# Patient Record
Sex: Female | Born: 1980 | Hispanic: No | Marital: Married | Smoking: Never smoker
Health system: Southern US, Community
[De-identification: ages and names within clinical notes are randomized; demographics above are authoritative.]

## PROBLEM LIST (undated history)

## (undated) ENCOUNTER — Inpatient Hospital Stay (HOSPITAL_COMMUNITY): Payer: Self-pay

## (undated) DIAGNOSIS — D219 Benign neoplasm of connective and other soft tissue, unspecified: Secondary | ICD-10-CM

## (undated) DIAGNOSIS — O139 Gestational [pregnancy-induced] hypertension without significant proteinuria, unspecified trimester: Secondary | ICD-10-CM

## (undated) DIAGNOSIS — R011 Cardiac murmur, unspecified: Secondary | ICD-10-CM

---

## 2000-01-27 ENCOUNTER — Emergency Department (HOSPITAL_COMMUNITY): Admission: EM | Admit: 2000-01-27 | Discharge: 2000-01-28 | Payer: Self-pay | Admitting: Emergency Medicine

## 2000-01-28 ENCOUNTER — Encounter: Payer: Self-pay | Admitting: Emergency Medicine

## 2007-06-01 ENCOUNTER — Emergency Department (HOSPITAL_COMMUNITY): Admission: EM | Admit: 2007-06-01 | Discharge: 2007-06-01 | Payer: Self-pay | Admitting: Emergency Medicine

## 2010-04-29 ENCOUNTER — Inpatient Hospital Stay (HOSPITAL_COMMUNITY): Admission: AD | Admit: 2010-04-29 | Payer: Self-pay | Admitting: Obstetrics and Gynecology

## 2010-06-27 ENCOUNTER — Inpatient Hospital Stay (HOSPITAL_COMMUNITY)
Admission: AD | Admit: 2010-06-27 | Discharge: 2010-06-29 | DRG: 886 | Disposition: A | Discharge status: Home / Self Care | Payer: BC Managed Care – PPO | Source: Ambulatory Visit | Attending: Obstetrics | Admitting: Obstetrics

## 2010-06-27 DIAGNOSIS — O139 Gestational [pregnancy-induced] hypertension without significant proteinuria, unspecified trimester: Principal | ICD-10-CM | POA: Diagnosis present

## 2010-06-27 DIAGNOSIS — Z22330 Carrier of Group B streptococcus: Secondary | ICD-10-CM

## 2010-06-27 DIAGNOSIS — O1490 Unspecified pre-eclampsia, unspecified trimester: Secondary | ICD-10-CM

## 2010-06-27 DIAGNOSIS — O99891 Other specified diseases and conditions complicating pregnancy: Secondary | ICD-10-CM | POA: Diagnosis present

## 2010-06-27 LAB — CBC
HCT: 33.3 % — ABNORMAL LOW (ref 36.0–46.0)
HCT: 32.6 % — ABNORMAL LOW (ref 36.0–46.0)
Hemoglobin: 11.5 g/dL — ABNORMAL LOW (ref 12.0–15.0)
Hemoglobin: 11.5 g/dL — ABNORMAL LOW (ref 12.0–15.0)
MCH: 28 pg (ref 26.0–34.0)
MCHC: 34.5 g/dL (ref 30.0–36.0)
MCHC: 35.3 g/dL (ref 30.0–36.0)
MCV: 81 fL (ref 78.0–100.0)
MCV: 80.7 fL (ref 78.0–100.0)
Platelets: 189 10*3/uL (ref 150–400)
RBC: 4.11 MIL/uL (ref 3.87–5.11)
RDW: 12.6 % (ref 11.5–15.5)
RDW: 12.6 % (ref 11.5–15.5)
WBC: 10.5 10*3/uL (ref 4.0–10.5)
WBC: 11.7 10*3/uL — ABNORMAL HIGH (ref 4.0–10.5)

## 2010-06-27 LAB — COMPREHENSIVE METABOLIC PANEL
ALT: 38 U/L — ABNORMAL HIGH (ref 0–35)
ALT: 38 U/L — ABNORMAL HIGH (ref 0–35)
AST: 38 U/L — ABNORMAL HIGH (ref 0–37)
Albumin: 2.6 g/dL — ABNORMAL LOW (ref 3.5–5.2)
Alkaline Phosphatase: 121 U/L — ABNORMAL HIGH (ref 39–117)
Alkaline Phosphatase: 124 U/L — ABNORMAL HIGH (ref 39–117)
BUN: 8 mg/dL (ref 6–23)
BUN: 9 mg/dL (ref 6–23)
CO2: 20 mEq/L (ref 19–32)
CO2: 19 mEq/L (ref 19–32)
Calcium: 8.8 mg/dL (ref 8.4–10.5)
Chloride: 107 mEq/L (ref 96–112)
Creatinine, Ser: 0.5 mg/dL (ref 0.4–1.2)
GFR calc Af Amer: >60 mL/min (ref >60)
GFR calc non Af Amer: >60 mL/min (ref >60)
GFR calc non Af Amer: >60 mL/min (ref >60)
Glucose, Bld: 88 mg/dL (ref 70–99)
Glucose, Bld: 121 mg/dL — ABNORMAL HIGH (ref 70–99)
Potassium: 3.4 mEq/L — ABNORMAL LOW (ref 3.5–5.1)
Potassium: 3.7 mEq/L (ref 3.5–5.1)
Sodium: 134 mEq/L — ABNORMAL LOW (ref 135–145)
Sodium: 131 mEq/L — ABNORMAL LOW (ref 135–145)
Total Bilirubin: 0.6 mg/dL (ref 0.3–1.2)
Total Bilirubin: 0.4 mg/dL (ref 0.3–1.2)
Total Protein: 6 g/dL (ref 6.0–8.3)
Total Protein: 6.1 g/dL (ref 6.0–8.3)

## 2010-06-27 LAB — URIC ACID: Uric Acid, Serum: 5.5 mg/dL (ref 2.4–7.0)

## 2010-06-27 LAB — LACTATE DEHYDROGENASE: LDH: 171 U/L (ref 94–250)

## 2010-06-28 ENCOUNTER — Ambulatory Visit (HOSPITAL_COMMUNITY)
Admit: 2010-06-28 | Discharge: 2010-06-28 | Disposition: A | Discharge status: Home / Self Care | Payer: BC Managed Care – PPO | Attending: Obstetrics | Admitting: Obstetrics

## 2010-06-28 LAB — COMPREHENSIVE METABOLIC PANEL
ALT: 39 U/L — ABNORMAL HIGH (ref 0–35)
AST: 42 U/L — ABNORMAL HIGH (ref 0–37)
Albumin: 2.5 g/dL — ABNORMAL LOW (ref 3.5–5.2)
Alkaline Phosphatase: 120 U/L — ABNORMAL HIGH (ref 39–117)
Calcium: 7.5 mg/dL — ABNORMAL LOW (ref 8.4–10.5)
GFR calc Af Amer: >60 mL/min (ref >60)
Glucose, Bld: 118 mg/dL — ABNORMAL HIGH (ref 70–99)
Potassium: 4.1 mEq/L (ref 3.5–5.1)
Sodium: 132 mEq/L — ABNORMAL LOW (ref 135–145)
Total Protein: 5.4 g/dL — ABNORMAL LOW (ref 6.0–8.3)

## 2010-06-28 LAB — CBC
HCT: 31.6 % — ABNORMAL LOW (ref 36.0–46.0)
MCHC: 34.5 g/dL (ref 30.0–36.0)
Platelets: 199 10*3/uL (ref 150–400)
RDW: 12.7 % (ref 11.5–15.5)
WBC: 14.4 10*3/uL — ABNORMAL HIGH (ref 4.0–10.5)

## 2010-06-28 LAB — URIC ACID: Uric Acid, Serum: 5.5 mg/dL (ref 2.4–7.0)

## 2010-06-29 LAB — PROTEIN, URINE, 24 HOUR
Collection Interval-UPROT: 24 hours
Urine Total Volume-UPROT: 3275 mL

## 2010-06-29 LAB — CREATININE CLEARANCE, URINE, 24 HOUR
Creatinine, 24H Ur: 1110 mg/d (ref 700–1800)
Creatinine, Urine: 33.9 mg/dL
Creatinine: 0.6 mg/dL (ref 0.4–1.2)

## 2010-07-21 ENCOUNTER — Encounter (HOSPITAL_COMMUNITY): Payer: Self-pay | Admitting: Radiology

## 2010-07-21 ENCOUNTER — Inpatient Hospital Stay (HOSPITAL_COMMUNITY): Payer: BC Managed Care – PPO

## 2010-07-21 ENCOUNTER — Other Ambulatory Visit (HOSPITAL_COMMUNITY): Payer: BC Managed Care – PPO

## 2010-07-21 ENCOUNTER — Inpatient Hospital Stay (HOSPITAL_COMMUNITY)
Admission: AD | Admit: 2010-07-21 | Discharge: 2010-07-29 | DRG: 651 | Disposition: A | Discharge status: Home Health | Payer: BC Managed Care – PPO | Source: Ambulatory Visit | Attending: Obstetrics | Admitting: Obstetrics

## 2010-07-21 DIAGNOSIS — D252 Subserosal leiomyoma of uterus: Secondary | ICD-10-CM | POA: Diagnosis present

## 2010-07-21 DIAGNOSIS — O99892 Other specified diseases and conditions complicating childbirth: Secondary | ICD-10-CM | POA: Diagnosis present

## 2010-07-21 DIAGNOSIS — D4959 Neoplasm of unspecified behavior of other genitourinary organ: Secondary | ICD-10-CM | POA: Diagnosis present

## 2010-07-21 DIAGNOSIS — O34599 Maternal care for other abnormalities of gravid uterus, unspecified trimester: Secondary | ICD-10-CM | POA: Diagnosis present

## 2010-07-21 DIAGNOSIS — O1414 Severe pre-eclampsia complicating childbirth: Principal | ICD-10-CM | POA: Diagnosis present

## 2010-07-21 DIAGNOSIS — O26849 Uterine size-date discrepancy, unspecified trimester: Secondary | ICD-10-CM

## 2010-07-21 DIAGNOSIS — Z22330 Carrier of Group B streptococcus: Secondary | ICD-10-CM

## 2010-07-21 LAB — URINALYSIS, ROUTINE W REFLEX MICROSCOPIC
Glucose, UA: NEGATIVE mg/dL
Ketones, ur: NEGATIVE mg/dL
Leukocytes, UA: NEGATIVE
pH: 6.5 (ref 5.0–8.0)

## 2010-07-21 LAB — COMPREHENSIVE METABOLIC PANEL
ALT: 23 U/L (ref 0–35)
AST: 32 U/L (ref 0–37)
Albumin: 2.4 g/dL — ABNORMAL LOW (ref 3.5–5.2)
Alkaline Phosphatase: 191 U/L — ABNORMAL HIGH (ref 39–117)
CO2: 20 mEq/L (ref 19–32)
Chloride: 107 mEq/L (ref 96–112)
GFR calc Af Amer: >60 mL/min (ref >60)
GFR calc non Af Amer: >60 mL/min (ref >60)
Potassium: 4.6 mEq/L (ref 3.5–5.1)
Total Bilirubin: 0.4 mg/dL (ref 0.3–1.2)

## 2010-07-21 LAB — CBC
Hemoglobin: 11.6 g/dL — ABNORMAL LOW (ref 12.0–15.0)
MCV: 79.5 fL (ref 78.0–100.0)
Platelets: 164 10*3/uL (ref 150–400)
RBC: 4.29 MIL/uL (ref 3.87–5.11)
WBC: 13.4 10*3/uL — ABNORMAL HIGH (ref 4.0–10.5)

## 2010-07-21 LAB — URINE MICROSCOPIC-ADD ON

## 2010-07-22 ENCOUNTER — Inpatient Hospital Stay (HOSPITAL_COMMUNITY): Payer: BC Managed Care – PPO

## 2010-07-22 LAB — COMPREHENSIVE METABOLIC PANEL
Albumin: 2.1 g/dL — ABNORMAL LOW (ref 3.5–5.2)
BUN: 13 mg/dL (ref 6–23)
Calcium: 7.5 mg/dL — ABNORMAL LOW (ref 8.4–10.5)
Chloride: 106 mEq/L (ref 96–112)
Creatinine, Ser: 0.77 mg/dL (ref 0.4–1.2)
Total Bilirubin: 0.5 mg/dL (ref 0.3–1.2)
Total Protein: 4.9 g/dL — ABNORMAL LOW (ref 6.0–8.3)

## 2010-07-22 LAB — CREATININE CLEARANCE, URINE, 24 HOUR
Collection Interval-CRCL: 24 hours
Creatinine, Urine: 41.1 mg/dL
Creatinine: 0.77 mg/dL (ref 0.4–1.2)
Urine Total Volume-CRCL: 2225 mL

## 2010-07-22 LAB — CBC
MCH: 26.4 pg (ref 26.0–34.0)
MCHC: 32.8 g/dL (ref 30.0–36.0)
MCV: 80.3 fL (ref 78.0–100.0)
Platelets: 156 10*3/uL (ref 150–400)
RDW: 13.8 % (ref 11.5–15.5)

## 2010-07-22 LAB — MAGNESIUM: Magnesium: 7.4 mg/dL (ref 1.5–2.5)

## 2010-07-23 LAB — URINE MICROSCOPIC-ADD ON

## 2010-07-23 LAB — COMPREHENSIVE METABOLIC PANEL
ALT: 18 U/L (ref 0–35)
ALT: 16 U/L (ref 0–35)
AST: 27 U/L (ref 0–37)
AST: 31 U/L (ref 0–37)
Albumin: 2 g/dL — ABNORMAL LOW (ref 3.5–5.2)
Alkaline Phosphatase: 150 U/L — ABNORMAL HIGH (ref 39–117)
BUN: 15 mg/dL (ref 6–23)
BUN: 14 mg/dL (ref 6–23)
CO2: 18 mEq/L — ABNORMAL LOW (ref 19–32)
CO2: 20 mEq/L (ref 19–32)
Calcium: 7.4 mg/dL — ABNORMAL LOW (ref 8.4–10.5)
Calcium: 7.5 mg/dL — ABNORMAL LOW (ref 8.4–10.5)
Chloride: 109 mEq/L (ref 96–112)
Creatinine, Ser: 0.69 mg/dL (ref 0.4–1.2)
Creatinine, Ser: 0.78 mg/dL (ref 0.4–1.2)
GFR calc Af Amer: >60 mL/min (ref >60)
GFR calc Af Amer: >60 mL/min (ref >60)
GFR calc non Af Amer: >60 mL/min (ref >60)
GFR calc non Af Amer: >60 mL/min (ref >60)
Glucose, Bld: 63 mg/dL — ABNORMAL LOW (ref 70–99)
Potassium: 4.3 mEq/L (ref 3.5–5.1)
Sodium: 133 mEq/L — ABNORMAL LOW (ref 135–145)
Total Bilirubin: 0.5 mg/dL (ref 0.3–1.2)
Total Bilirubin: 0.4 mg/dL (ref 0.3–1.2)
Total Protein: 5.1 g/dL — ABNORMAL LOW (ref 6.0–8.3)

## 2010-07-23 LAB — CBC
HCT: 31.2 % — ABNORMAL LOW (ref 36.0–46.0)
Hemoglobin: 10.4 g/dL — ABNORMAL LOW (ref 12.0–15.0)
Hemoglobin: 10.6 g/dL — ABNORMAL LOW (ref 12.0–15.0)
Hemoglobin: 11.1 g/dL — ABNORMAL LOW (ref 12.0–15.0)
MCH: 27 pg (ref 26.0–34.0)
MCH: 27 pg (ref 26.0–34.0)
MCHC: 33.5 g/dL (ref 30.0–36.0)
MCHC: 33.7 g/dL (ref 30.0–36.0)
MCV: 80.8 fL (ref 78.0–100.0)
MCV: 80.6 fL (ref 78.0–100.0)
Platelets: 128 10*3/uL — ABNORMAL LOW (ref 150–400)
Platelets: 141 10*3/uL — ABNORMAL LOW (ref 150–400)
RBC: 3.86 MIL/uL — ABNORMAL LOW (ref 3.87–5.11)
RDW: 13.8 % (ref 11.5–15.5)
WBC: 10.1 10*3/uL (ref 4.0–10.5)

## 2010-07-23 LAB — URINALYSIS, ROUTINE W REFLEX MICROSCOPIC
Bilirubin Urine: NEGATIVE
Glucose, UA: NEGATIVE mg/dL
Specific Gravity, Urine: >1.03 — ABNORMAL HIGH (ref 1.005–1.030)
Urobilinogen, UA: 0.2 mg/dL (ref 0.0–1.0)
pH: 8 (ref 5.0–8.0)

## 2010-07-23 LAB — URIC ACID
Uric Acid, Serum: 8.3 mg/dL — ABNORMAL HIGH (ref 2.4–7.0)
Uric Acid, Serum: 8.1 mg/dL — ABNORMAL HIGH (ref 2.4–7.0)

## 2010-07-23 LAB — MAGNESIUM
Magnesium: 6.2 mg/dL (ref 1.5–2.5)
Magnesium: 4.7 mg/dL — ABNORMAL HIGH (ref 1.5–2.5)
Magnesium: 5 mg/dL — ABNORMAL HIGH (ref 1.5–2.5)

## 2010-07-23 LAB — LACTATE DEHYDROGENASE: LDH: 227 U/L (ref 94–250)

## 2010-07-24 ENCOUNTER — Other Ambulatory Visit: Payer: Self-pay | Admitting: Obstetrics and Gynecology

## 2010-07-24 ENCOUNTER — Inpatient Hospital Stay (HOSPITAL_COMMUNITY): Payer: BC Managed Care – PPO

## 2010-07-24 LAB — CBC
HCT: 32.6 % — ABNORMAL LOW (ref 36.0–46.0)
Platelets: 130 10*3/uL — ABNORMAL LOW (ref 150–400)
RBC: 4.02 MIL/uL (ref 3.87–5.11)
RDW: 13.9 % (ref 11.5–15.5)
WBC: 12.4 10*3/uL — ABNORMAL HIGH (ref 4.0–10.5)

## 2010-07-24 LAB — MRSA PCR SCREENING: MRSA by PCR: NEGATIVE

## 2010-07-24 LAB — GLUCOSE, CAPILLARY
Glucose-Capillary: 109 mg/dL — ABNORMAL HIGH (ref 70–99)
Glucose-Capillary: 88 mg/dL (ref 70–99)

## 2010-07-24 LAB — MAGNESIUM
Magnesium: 5.8 mg/dL — ABNORMAL HIGH (ref 1.5–2.5)
Magnesium: 5.3 mg/dL — ABNORMAL HIGH (ref 1.5–2.5)

## 2010-07-24 LAB — PROTEIN, URINE, 24 HOUR: Urine Total Volume-UPROT: 2225 mL

## 2010-07-24 LAB — ABO/RH: ABO/RH(D): B NEG

## 2010-07-25 LAB — CBC
Platelets: 129 10*3/uL — ABNORMAL LOW (ref 150–400)
RDW: 14.1 % (ref 11.5–15.5)
WBC: 13.5 10*3/uL — ABNORMAL HIGH (ref 4.0–10.5)

## 2010-07-25 LAB — MAGNESIUM: Magnesium: 5.1 mg/dL — ABNORMAL HIGH (ref 1.5–2.5)

## 2010-07-25 LAB — RPR: RPR Ser Ql: NONREACTIVE

## 2010-07-26 LAB — RH IMMUNE GLOB WKUP(>/=20WKS)(NOT WOMEN'S HOSP)
Antibody Screen: POSITIVE
Fetal Screen: NEGATIVE

## 2010-07-26 NOTE — Op Note (Signed)
[name], Carolyn Galloway                ACCOUNT NO.:  [id number]  MEDICAL RECORD NO.:  [id number]           PATIENT TYPE:  I  LOCATION:  9372                          [facility]ILITY:  [facility]  PHYSICIAN:  Maxie Better, M.D.DATE OF BIRTH:  [birth year]  DATE OF PROCEDURE:  07/24/2010 DATE OF DISCHARGE:                              OPERATIVE REPORT   PREOPERATIVE DIAGNOSES: 1. Arrest of dilatation. 2. Severe preeclampsia. 3. Intrauterine gestation at 33-5/7 weeks.  PROCEDURES: 1. Primary cesarean section, Kerr hysterotomy.  POSTOPERATIVE DIAGNOSES: 1. Right occiput posterior presentation. 2. Arrest of dilatation. 3. Severe preeclampsia. 4. Intrauterine gestation at 33-5/7 weeks. 5. Fibroid uterus.  ANESTHESIA:  Epidural.  SURGEON:  Maxie Better, MD  ASSISTANT:  None.  PROCEDURE IN DETAIL:  Under adequate epidural anesthesia, the patient was placed in supine position with left lateral tilt.  An indwelling Foley catheter was already in place.  Marcaine 0.25% was injected along the planned Pfannenstiel skin incision site.  Pfannenstiel skin incision was then made and carried down to the rectus fascia.  Rectus fascia was opened transversely.  The rectus fascia was then bluntly and sharply dissected off the rectus muscle in superior and inferior fashion.  The rectus muscle was split in midline.  The parietal peritoneum was entered bluntly and extended.  A well developed lower uterine segment was noted.  The bladder flap was opened transversely and the bladder was bluntly dissected off the lower uterine segment.  A curvilinear low transverse uterine incision was then made and extended with bandage scissors.  Loop of cord produced projected into the incision.  Subsequent delivery of a live female from the right occiput posterior position was accomplished. Cord around the neck was reduced.  Baby was bulb suctioned in the abdomen.  Cord was clamped, cut, the baby was transferred to  the awaiting pediatrician, who assigned Apgars of 5 and 8 at 1 and 5 minutes.  The placenta was spontaneous, intact, small, sent to Pathology. Uterine cavity was cleaned of debris.  Uterine incision had no extension.  The uterine incision was closed in two layers, the first layer with zero Monocryl in running locked stitch, second layer is imbricated using 0 Monocryl sutures.  Good hemostasis was noted at that time.  Normal tubes and ovaries were noted bilaterally.  A large left posterior upper 6-cm subserosal fibroid was noted.  The abdomen was then copiously irrigated and suctioned the debris.  The parietal peritoneum was closed with 2-0 Vicryl.  The rectus fascia was closed with 0 Vicryl x2.  The subcutaneous area was irrigated, small bleeders cauterized. Interrupted 2-0 plain sutures placed.  4-0 Vicryl subcuticular stitches were then placed.  Specimen was placenta, sent to Pathology.  Estimated blood loss, 500 mL.  Urine output 250 mL concentrated urine. Intraoperative fluid, 1 liter.  Sponge and instrument counts x2 was correct.  Complications, none.  Weight of the baby was 3 pounds and 12 ounces.  The baby was transferred to the Neonatal Intensive Care Unit, and the mother was transferred to recovery in stable condition.     Maxie Better, M.D.     [place]/MEDQ  D:  07/24/2010  T:  07/25/2010  Job:  [id number]  Electronically Signed by Nena Jordan Krystyn Picking M.D. on 07/26/2010 05:26:48 AM

## 2010-07-28 LAB — COMPREHENSIVE METABOLIC PANEL
ALT: 127 U/L — ABNORMAL HIGH (ref 0–35)
Albumin: 2.2 g/dL — ABNORMAL LOW (ref 3.5–5.2)
Alkaline Phosphatase: 137 U/L — ABNORMAL HIGH (ref 39–117)
Glucose, Bld: 81 mg/dL (ref 70–99)
Potassium: 4.7 mEq/L (ref 3.5–5.1)
Sodium: 134 mEq/L — ABNORMAL LOW (ref 135–145)
Total Protein: 5.9 g/dL — ABNORMAL LOW (ref 6.0–8.3)

## 2010-07-28 LAB — DIFFERENTIAL
Basophils Absolute: 0 10*3/uL (ref 0.0–0.1)
Basophils Relative: 0 % (ref 0–1)
Eosinophils Absolute: 0.2 10*3/uL (ref 0.0–0.7)
Eosinophils Relative: 2 % (ref 0–5)
Lymphocytes Relative: 19 % (ref 12–46)
Lymphs Abs: 2.4 10*3/uL (ref 0.7–4.0)
Monocytes Absolute: 0.8 10*3/uL (ref 0.1–1.0)
Monocytes Relative: 6 % (ref 3–12)
Neutro Abs: 9.3 10*3/uL — ABNORMAL HIGH (ref 1.7–7.7)
Neutrophils Relative %: 73 % (ref 43–77)

## 2010-07-28 LAB — CBC
HCT: 31.9 % — ABNORMAL LOW (ref 36.0–46.0)
Hemoglobin: 10.3 g/dL — ABNORMAL LOW (ref 12.0–15.0)
MCH: 27.2 pg (ref 26.0–34.0)
MCHC: 32.3 g/dL (ref 30.0–36.0)
MCV: 84.2 fL (ref 78.0–100.0)
Platelets: 234 10*3/uL (ref 150–400)
RBC: 3.79 MIL/uL — ABNORMAL LOW (ref 3.87–5.11)
RDW: 15.1 % (ref 11.5–15.5)
WBC: 12.8 10*3/uL — ABNORMAL HIGH (ref 4.0–10.5)

## 2010-07-29 ENCOUNTER — Encounter (HOSPITAL_COMMUNITY)
Admission: RE | Admit: 2010-07-29 | Discharge: 2010-07-29 | Disposition: A | Discharge status: Home / Self Care | Payer: BC Managed Care – PPO | Source: Ambulatory Visit | Attending: Obstetrics and Gynecology | Admitting: Obstetrics and Gynecology

## 2010-07-29 DIAGNOSIS — O923 Agalactia: Secondary | ICD-10-CM | POA: Insufficient documentation

## 2010-07-29 LAB — COMPREHENSIVE METABOLIC PANEL
AST: 98 U/L — ABNORMAL HIGH (ref 0–37)
Albumin: 2 g/dL — ABNORMAL LOW (ref 3.5–5.2)
Calcium: 8.3 mg/dL — ABNORMAL LOW (ref 8.4–10.5)
Creatinine, Ser: 0.52 mg/dL (ref 0.4–1.2)
GFR calc Af Amer: >60 mL/min (ref >60)
GFR calc non Af Amer: >60 mL/min (ref >60)

## 2010-07-29 LAB — CBC
MCH: 27 pg (ref 26.0–34.0)
MCHC: 32.4 g/dL (ref 30.0–36.0)
Platelets: 207 10*3/uL (ref 150–400)

## 2010-07-29 LAB — DIFFERENTIAL
Basophils Relative: 1 % (ref 0–1)
Eosinophils Absolute: 0.3 10*3/uL (ref 0.0–0.7)
Eosinophils Relative: 2 % (ref 0–5)
Monocytes Absolute: 0.6 10*3/uL (ref 0.1–1.0)
Monocytes Relative: 5 % (ref 3–12)

## 2010-07-31 NOTE — Consult Note (Signed)
Carolyn Galloway, Carolyn Galloway                ACCOUNT NO.:  [id number]  MEDICAL RECORD NO.:  [id number]           PATIENT TYPE:  I  LOCATION:  9316                          [facility]:  [facility]  PHYSICIAN:  Hollice Espy, M.D.DATE OF BIRTH:  1980/05/23  DATE OF CONSULTATION:  07/28/2010 DATE OF DISCHARGE:                                CONSULTATION   ATTENDING PHYSICIAN:  Maxie Better, MD  REASON FOR CONSULTATION:  Request is management of uncontrolled high blood pressure, new diagnosis.  HISTORY OF PRESENT ILLNESS:  The patient is a 30 year old [place] female with no past medical history who has had a relatively uncomplicated pregnancy up until about 1 month prior when she was started noting to have elevated blood pressures.  Her only other notification was she put on about 30-40 pounds during her pregnancy, but otherwise did well and she was monitored closely and started having elevated blood pressure readings in the office requiring her to be admitted on July 21, 2010.  She was noted at that time to have a blood pressure of 184/114.  At this time, she was diagnosed, was felt to have severe pregnancy-induced hypertension and possible preeclampsia.  She has follow up liver function tests ordered, which were normal essentially as was kidney function and put on labetalol.  The patient was continued to be monitored when she started developing signs of some proteinuria and ended up being taken to the OR in the early morning hours of July 25, 2010, for emergent C-section secondary to severe preeclampsia.  Her post pressure, she remained in the adults ICU at [facility].  The surgery itself was uncomplicated and the infant was doing well; however, since early the patient's delivery, her blood pressures have continued to remain elevated with range anywhere from 130- 190 systolic and high 80s to 110s diastolic.  Despite multiple doses, the patient was started on labetalol, this  increased to 200 b.i.d., then increased to 200 t.i.d., then Procardia 30 and HCTZ 25 were added and the patient continued.  Meanwhile, the patient was asymptomatic.  She had no chest pain, no shortness of breath.  She has not had any significant pain.  Post C-section, she is not on any operative distress. No shortness of breath.  No other issues.  She feels fine other than notified for blood pressure.  By postop day #4, her blood pressure was still elevated 140-170 systolic, 85-120 diastolic.  [facility] was then called for further consult for further evaluation and assistance. At this point, then I increased her blood pressure medication of HCTZ from 25 to 50 and labetalol from 200 to 300 t.i.d. and I continued her Procardia.  She initially did briefly well, but then her blood pressure did elevate and she received a dose of IV Lopressor followed by dose of p.o. hydralazine 10 mg and p.o. hydralazine 10 mg 4 times a day.  After her first dose of several hours, her blood pressure since that time has started to dramatically improve and the blood pressure is now last checked at 131/92 with a heart rate of 80.  When I came to see  the patient about 5 o'clock in the evening, she was doing well.  She denies any headaches, vision changes, dysphagia, chest pain, palpitations, shortness of breath, wheeze, cough, abdominal pain, hematuria, dysuria, constipation, diarrhea, focal extremity numbness, weakness, pain.  REVIEW OF SYSTEMS:  Otherwise negative.  PAST MEDICAL HISTORY:  Other than, she is a G1, P1.  Complicated only pregnancy-induced hypertension and then preeclampsia.  She is also noted to be GBS positive.  MEDICATIONS:  Before her pregnancy she was on nothing.  During pregnancy, she was only on prenatal vitamins.  She does not take any herbal over-the-counter supplement.  ALLERGIES:  She has no known drug allergies.  SOCIAL HISTORY:  She denies any tobacco, alcohol or drug  use.  FAMILY HISTORY:  Noted for hypertension in her dad side of the family.  PHYSICAL EXAMINATION:  VITAL SIGNS:  Temperature 98.5, heart rate 80, blood pressure most recent 131/92 has risen to in the last 24 hours as high as systolic of 170, diastolic of 045. GENERAL:  She is alert and oriented x3 in no apparent distress. HEENT:  Normocephalic, atraumatic.  Mucous membranes are moist.  She has no carotid bruits. HEART:  Regular rate and rhythm.  S1 and S2.  She has a soft 2/6 benign systolic murmur, which she has had since birth, it is innocent sounding. LUNGS:  Clear to auscultation bilaterally. ABDOMEN:  Soft, nontender.  Her incision is nontender as well and nondistended.  She has normoactive bowel sounds. EXTREMITIES:  She has no clubbing or cyanosis.  She has a trace pitting edema.  LABORATORY DATA:  Mag level is high at 5.1.  White count elevated at 13.5, H and H 10 and 31, platelet count 129.  Her last electrolyte panel was done on May 25, pre-prepregnancy term.  Pre-prepregnancy sodium 133, potassium 4.2, chloride 106, bicarb 20, BUN 14, creatinine 0.8, glucose 89.  LFTs actually at that time are unremarkable except for now alk phos of 160 to be expected during her current state.  ASSESSMENT AND PLAN:  The patient with pregnancy-induced hypertension and preeclampsia with postpartum continuingly elevated blood pressures. The patient likely has bit of a delayed effect.  She is currently on Procardia, labetalol, hydrochlorothiazide and now hydralazine.  We are starting to see some improvement in her blood pressures.  I have started creating parameters with holding her hydralazine for systolic blood pressure less than 110 and holding all blood pressure medications should her blood pressure fall below 90.  The plan presented by the patient is that once her blood pressure is better controlled, she can be discharged to home.  We will have my partner followup to see the patient  on Saturday July 29, 2010, and if her blood pressures are improved, we perhaps may be able to streamline back some of her blood pressure medications, likely she will need to go home on blood pressure medication.  She has no PCP and only follows up with Dr. Cherly Hensen.  I am going to recommend that she do serial blood pressure checks at home. Continue on her medications with parameters to going to hold back or stop her blood pressure medicines if her blood pressure is too low and to call Dr. Wilson Singer when it is too high and then likely if everything is stable, then she will follow up in 1 week's time.  If her blood pressure shows decline, she is able to get off her medications, which I suspect will not indeed happen.  I do not think that  she will have longstanding hypertension and will not need to be on blood pressure medications long- term hopefully.  Of note, she has had a elevated CBC which is starting to trend up.  We would recommend repeat checking her CBC.  Pain does not look to be a factor in her elevated blood pressure.  There may be an underlying mild infectious process, so we will rule that out, which if corrected may assist with this plan.     Hollice Espy, M.D.     [facility]/MEDQ  D:  07/28/2010  T:  07/28/2010  Job:  [id number]  cc:   Maxie Better, M.D. Fax: [phone]  Electronically Signed by Virginia Rochester M.D. on 07/31/2010 02:37:52 PM

## 2010-08-29 ENCOUNTER — Encounter (HOSPITAL_COMMUNITY)
Admission: RE | Admit: 2010-08-29 | Discharge: 2010-08-29 | Disposition: A | Discharge status: Home / Self Care | Payer: BC Managed Care – PPO | Source: Ambulatory Visit | Attending: Obstetrics and Gynecology | Admitting: Obstetrics and Gynecology

## 2010-08-29 DIAGNOSIS — O923 Agalactia: Secondary | ICD-10-CM | POA: Insufficient documentation

## 2010-09-17 NOTE — Discharge Summary (Signed)
Carolyn Galloway, Carolyn Galloway                ACCOUNT NO.:  [id number]  MEDICAL RECORD NO.:  [id number]           PATIENT TYPE:  I  LOCATION:  9316                          [facility]  PHYSICIAN:  Maxie Better, M.D.DATE OF BIRTH:  [birth year]  DATE OF ADMISSION:  07/21/2010 DATE OF DISCHARGE:  07/29/2010                              DISCHARGE SUMMARY   ADMISSION DIAGNOSES: 1. Severe pregnancy-induced hypertension, rule out superimposed     preeclampsia. 2. Intrauterine gestation at 72 and 2/7 weeks.  DISCHARGE DIAGNOSES: 1. Severe preeclampsia. 2. Intrauterine gestation at 54 and 5/7 weeks, delivered, 3. Arrest of  dilatation. 4. Posterior fibroid. 5. Right occiput posterior presentation.  PROCEDURES:  A primary cesarean section, Kerr hysterotomy.  HISTORY OF PRESENT ILLNESS:  This is a 30 year old gravida 1, para 0 married female at 31 and 2/7 weeks, admitted to the antepartum service due to blood pressure in the office of 194/104 and complained of increased swelling.  The patient's prenatal course had been notable for admission from April 28 to April 29 for increased blood pressure and sent home on labetalol and bedrest.  The patient returned to the day of admission complaining of increased swelling of the face and legs over the past several days.  She had a 8-pound weight gain in a short time.  HOSPITAL COURSE:  The patient was admitted to the antepartum service. She was started on magnesium sulfate, labetalol IV was given.  Stat PIH test was ordered, ultrasound, NICU consultation, and 24-hour urine creatinine clearance was done.  The patient was already on betamethasone completed from her last admission.  The patient was placed on continuous monitoring.  She had a reactive tracing.  Urinalysis had revealed greater than 300 mg/dL of protein.  Her PIH labs subsequently showed normal platelet count of 164,000, hemoglobin 11.6, hematocrit 34.1, white count of 13.4, SGOT of  32, SGPT of 23, uric acid increased to 8.3. The other labs were normal.  The patient received IV labetalol.  She denied any other PIH symptoms.  Her cervix was closed, 70% soft, presenting part out of pelvis.  The patient had urine total protein of 2336 grams, platelets was 146K, uric acid was 8.3, this given the findings proceeding with induction was recommended and confirmed with Maternal Fetal Medicine.  The patient had a magnesium discontinued due elevated magnesium levels.  She was transferred to the antepartum service with cervical ripening, penicillin prophylaxis, and magnesium sulfate resumption at a lower dose.  The patient was induced.  Labetalol drip was initially started due to her blood pressures.  Low-dose Pitocin was subsequently started after 3 doses of Cytotec.  Magnesium levels were followed closely.  The patient subsequently had epidural for pain management.  On March 26, she was complaining of shortness of breath.  Her magnesium level was 5.8.  Her portable chest x-ray was done, which was negative. The cervix became 3-cm edematous, -3, -2 station, started to have some late decelerations, no repetitive triplets, couplets, and given the finding, decision was made to proceed a primary cesarean section. Please see the dictated operative note.  Procedure resulted in the delivery  of a live female, loop of cord around the neck and chest, 3 pounds 12 ounces who was transferred to the NICU, Apgars of 5 and 8, a 6- cm left subserosal fibroid was noted at the time of her surgery. Postoperatively, the patient was transferred to the intensive care unit. She was continued on magnesium sulfate.  Labetalol, hydrochlorothiazide was added.  The patient finally was diuresing.  Her magnesium sulfate was discontinued.  The CBC on postop day #1 showed a platelet count of 130,000, hemoglobin 11, hematocrit 32.6, white count of 12.4.  Repeat PIH labs had revealed elevation of her uric acid still  remained.  The ALT and AST remained normal.  The alk phosphatase was elevated.  The patient was subsequently transferred to intensive care unit after she was diuresing well, monitored closely on the floor.  She was also given Apresoline for blood pressure management.  By March 28, she was diuresing well.  She was transferred postpartum.  Procardia was added to her blood pressure regimens.  Her blood pressure ranged at that point between 140 to 180 over 94 to 108.  Her Procardia was 30 XL p.o. daily and labetalol was 200 mg p.o. b.i.d., hydrochlorothiazide 25 mg daily. Consultation was made for the hospitalist, Dr. Rito Ehrlich, due to persistent high blood pressures.  Her labetalol was subsequently increased, as was the hydrochlorothiazide.  By postop day #5, her blood pressure is 134-149/88-100, had intermittent increased levels.  Repeat CBC on postop day #5 showed a hemoglobin 9.7, hematocrit 29.9, platelet count of 207,000.  The rest of her liver studies had decreased from 134 SGOT and the SGPT to 103 from 127.  She was subsequently felt to be able to be discharged home.  DISPOSITION:  Home.  CONDITION:  Stable.DISCHARGE MEDICATIONS: 1. Hydralazine 10 mg p.o. q.i.d. 2. Labetalol 300 mg p.o. t.i.d. 3. Hydrochlorothiazide 50 mg p.o. daily. 4. Procardia XL 30 mg p.o. daily 5. Nu-Iron 150 mg p.o. b.i.d. 6. Tylox 1-2 tablets every 3-4 hours p.r.n. pain. 7. Motrin 800 mg p.o. q. 6-8 hours p.r.n. pain. 8. Prenatal vitamins 1 p.o. daily.  Followup appointment at [facility] for blood pressure management. Discharge instructions per the postpartum booklet and reiteration of the severe preeclampsia warning sign.     Maxie Better, M.D.     [place]/MEDQ  D:  09/11/2010  T:  09/11/2010  Job:  [id number]  Electronically Signed by Nena Jordan Daryn Pisani M.D. on 09/17/2010 02:33:42 PM

## 2010-09-29 ENCOUNTER — Encounter (HOSPITAL_COMMUNITY)
Admission: RE | Admit: 2010-09-29 | Discharge: 2010-09-29 | Disposition: A | Discharge status: Home / Self Care | Payer: BC Managed Care – PPO | Source: Ambulatory Visit | Attending: Obstetrics and Gynecology | Admitting: Obstetrics and Gynecology

## 2010-09-29 DIAGNOSIS — O923 Agalactia: Secondary | ICD-10-CM | POA: Insufficient documentation

## 2010-10-30 ENCOUNTER — Encounter (HOSPITAL_COMMUNITY)
Admission: RE | Admit: 2010-10-30 | Discharge: 2010-10-30 | Disposition: A | Discharge status: Home / Self Care | Payer: BC Managed Care – PPO | Source: Ambulatory Visit | Attending: Obstetrics and Gynecology | Admitting: Obstetrics and Gynecology

## 2010-10-30 DIAGNOSIS — O923 Agalactia: Secondary | ICD-10-CM | POA: Insufficient documentation

## 2011-11-08 LAB — OB RESULTS CONSOLE ABO/RH: RH Type: NEGATIVE

## 2011-11-08 LAB — OB RESULTS CONSOLE RPR: RPR: NONREACTIVE

## 2012-01-26 IMAGING — CR DG CHEST 1V PORT
1 series · 1 of 1 positions shown · non-contrast
Comparison: None.

CLINICAL DATA: Shortness of breath.  Hypertension.  Pregnant
female.

PORTABLE CHEST - 1 VIEW

[view not recorded]
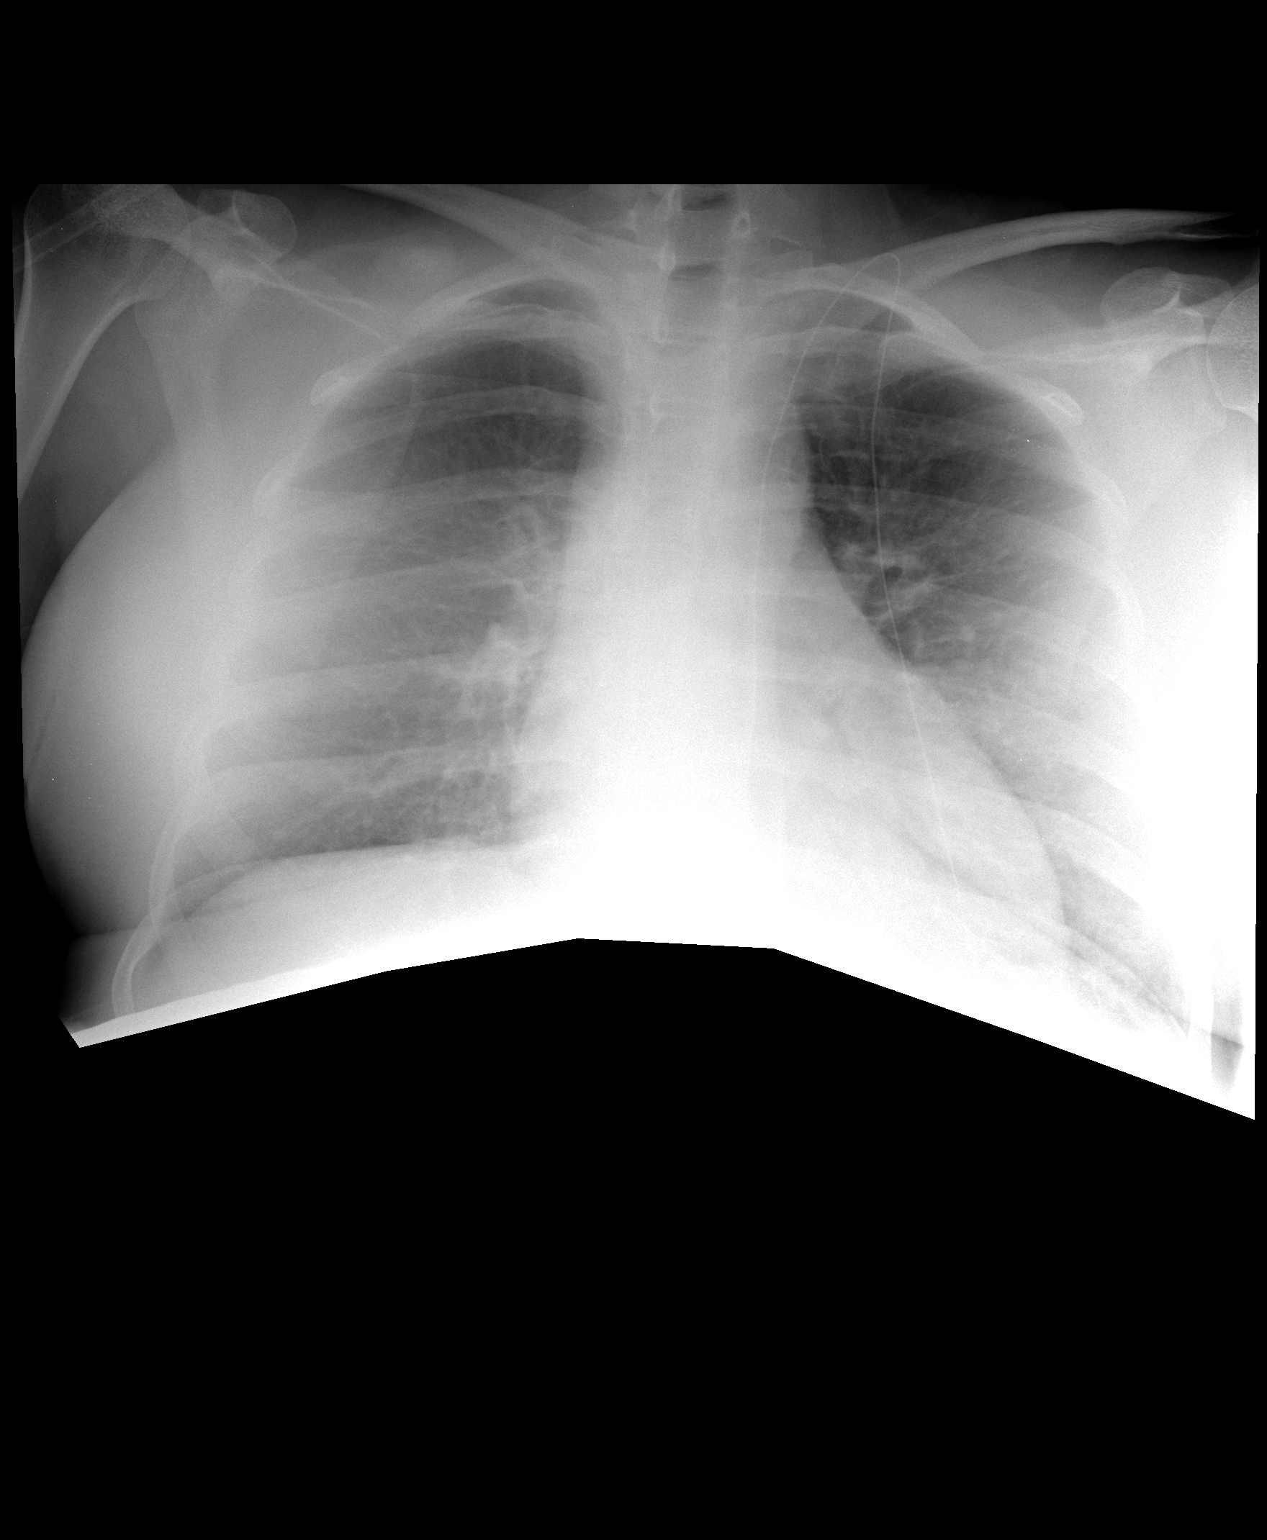

[1 of 1 positions shown; findings below may reference images not displayed]

FINDINGS: The lungs are clear.  Heart size is normal.  No
pneumothorax or pleural effusion.  No pulmonary edema.
IMPRESSION: No acute disease.

## 2012-04-13 ENCOUNTER — Inpatient Hospital Stay (HOSPITAL_COMMUNITY)
Admission: AD | Admit: 2012-04-13 | Discharge: 2012-04-15 | DRG: 886 | Disposition: A | Discharge status: Home / Self Care | Payer: BC Managed Care – PPO | Source: Ambulatory Visit | Attending: Obstetrics and Gynecology | Admitting: Obstetrics and Gynecology

## 2012-04-13 ENCOUNTER — Encounter (HOSPITAL_COMMUNITY): Payer: Self-pay | Admitting: *Deleted

## 2012-04-13 DIAGNOSIS — O34219 Maternal care for unspecified type scar from previous cesarean delivery: Secondary | ICD-10-CM | POA: Diagnosis present

## 2012-04-13 DIAGNOSIS — O139 Gestational [pregnancy-induced] hypertension without significant proteinuria, unspecified trimester: Principal | ICD-10-CM | POA: Diagnosis present

## 2012-04-13 HISTORY — DX: Gestational (pregnancy-induced) hypertension without significant proteinuria, unspecified trimester: O13.9

## 2012-04-13 HISTORY — DX: Cardiac murmur, unspecified: R01.1

## 2012-04-13 NOTE — MAU Note (Signed)
Pt reports "my blood pressure has been creeping up on me and i had preeclampsia with my first baby and i was getting worried"

## 2012-04-14 ENCOUNTER — Encounter (HOSPITAL_COMMUNITY): Payer: Self-pay | Admitting: *Deleted

## 2012-04-14 ENCOUNTER — Inpatient Hospital Stay (HOSPITAL_COMMUNITY): Payer: BC Managed Care – PPO

## 2012-04-14 DIAGNOSIS — O139 Gestational [pregnancy-induced] hypertension without significant proteinuria, unspecified trimester: Secondary | ICD-10-CM | POA: Diagnosis present

## 2012-04-14 LAB — URINALYSIS, ROUTINE W REFLEX MICROSCOPIC
Bilirubin Urine: NEGATIVE
Hgb urine dipstick: NEGATIVE
Nitrite: NEGATIVE
Specific Gravity, Urine: 1.01 (ref 1.005–1.030)
pH: 6.5 (ref 5.0–8.0)

## 2012-04-14 LAB — COMPREHENSIVE METABOLIC PANEL
ALT: 13 U/L (ref 0–35)
AST: 20 U/L (ref 0–37)
Alkaline Phosphatase: 166 U/L — ABNORMAL HIGH (ref 39–117)
CO2: 22 mEq/L (ref 19–32)
Calcium: 9.2 mg/dL (ref 8.4–10.5)
Chloride: 103 mEq/L (ref 96–112)
GFR calc Af Amer: >90 mL/min (ref >90)
GFR calc non Af Amer: >90 mL/min (ref >90)
Glucose, Bld: 98 mg/dL (ref 70–99)
Sodium: 135 mEq/L (ref 135–145)
Total Bilirubin: 0.2 mg/dL — ABNORMAL LOW (ref 0.3–1.2)

## 2012-04-14 LAB — PREPARE RBC (CROSSMATCH)

## 2012-04-14 LAB — CBC
Hemoglobin: 10.6 g/dL — ABNORMAL LOW (ref 12.0–15.0)
MCH: 26.1 pg (ref 26.0–34.0)
RBC: 4.06 MIL/uL (ref 3.87–5.11)
WBC: 11.5 10*3/uL — ABNORMAL HIGH (ref 4.0–10.5)

## 2012-04-14 LAB — OB RESULTS CONSOLE GC/CHLAMYDIA: Chlamydia: NEGATIVE

## 2012-04-14 MED ORDER — ACETAMINOPHEN 325 MG PO TABS: 650.0000 mg | ORAL_TABLET | ORAL | Status: DC | PRN

## 2012-04-14 MED ORDER — SODIUM CHLORIDE 0.9 % IV SOLN: 250.0000 mL | INTRAVENOUS | Status: DC | PRN

## 2012-04-14 MED ORDER — SODIUM CHLORIDE 0.9 % IJ SOLN
3.0000 mL | INTRAMUSCULAR | Status: DC | PRN
  Administered 2012-04-15: 3 mL via INTRAVENOUS

## 2012-04-14 MED ORDER — ZOLPIDEM TARTRATE 5 MG PO TABS: 5.0000 mg | ORAL_TABLET | Freq: Every evening | ORAL | Status: DC | PRN

## 2012-04-14 MED ORDER — BETAMETHASONE SOD PHOS & ACET 6 (3-3) MG/ML IJ SUSP
12.0000 mg | INTRAMUSCULAR | Status: AC
  Administered 2012-04-14 – 2012-04-15 (×2): 12 mg via INTRAMUSCULAR
  Filled 2012-04-14 (×2): qty 2

## 2012-04-14 MED ORDER — DOCUSATE SODIUM 100 MG PO CAPS
100.0000 mg | ORAL_CAPSULE | Freq: Every day | ORAL | Status: DC
  Administered 2012-04-14 – 2012-04-15 (×2): 100 mg via ORAL
  Filled 2012-04-14 (×2): qty 1

## 2012-04-14 MED ORDER — CALCIUM CARBONATE ANTACID 500 MG PO CHEW: 2.0000 | CHEWABLE_TABLET | ORAL | Status: DC | PRN

## 2012-04-14 MED ORDER — LABETALOL HCL 100 MG PO TABS
100.0000 mg | ORAL_TABLET | Freq: Two times a day (BID) | ORAL | Status: DC
  Administered 2012-04-14 – 2012-04-15 (×4): 100 mg via ORAL
  Filled 2012-04-14 (×5): qty 1

## 2012-04-14 MED ORDER — SODIUM CHLORIDE 0.9 % IJ SOLN
3.0000 mL | Freq: Two times a day (BID) | INTRAMUSCULAR | Status: DC
  Administered 2012-04-14 (×2): 3 mL via INTRAVENOUS

## 2012-04-14 MED ORDER — PRENATAL MULTIVITAMIN CH
1.0000 | ORAL_TABLET | Freq: Every day | ORAL | Status: DC
  Administered 2012-04-14 – 2012-04-15 (×2): 1 via ORAL
  Filled 2012-04-14 (×2): qty 1

## 2012-04-14 NOTE — Progress Notes (Signed)
Admission nutrition screen triggered, weight loss > 10 Lbs in one month. PNR indicates no weight loss, and steady weight gain.  Patients chart reviewed and assessed  for nutritional risk. Patient is determined to be at low nutrition  risk.  Elisabeth Cara M.Odis Luster LDN Neonatal Nutrition Support Specialist Pager [phone]

## 2012-04-14 NOTE — Progress Notes (Signed)
S: feels well. Denies any h/a (+) FM  O: BP 119/77 afebrile abd gravid nontender Extr. No edema  Tracing: baseline 140 reactive occ ctx GBS cx pending Urine collection  In progress  PIH labs nl  IMP: PIH managed w/ current BP med IUP @ 33 1/7 week Prev LTCS P) 2nd betatmethasone. Await 24 hr urine results. Cont labetalol

## 2012-04-14 NOTE — H&P (Signed)
Carolyn Galloway is a 31 y.o. female presenting  @ [redacted] weeks gestation w/ hx LTCS with c/o headache and elev BP at home. Prev delivery @ 32 weeks for severe preeclampsia. Pt denies leg swelling or blurred vision. (+) FM no epigastric pain. BP here initially  130/103, 140/108 History OB History    Grav Para Term Preterm Abortions TAB SAB Ect Mult Living   2 1 0 1      1     Past Medical History  Diagnosis Date  . Pregnancy induced hypertension   . Heart murmur    Past Surgical History  Procedure Date  . Cesarean section    Family History: family history is not on file. Social History:  reports that she has never smoked. She has never used smokeless tobacco. She reports that she does not drink alcohol or use illicit drugs.   Prenatal Transfer Tool  Maternal Diabetes: No Genetic Screening: Declined Maternal Ultrasounds/Referrals: Normal Fetal Ultrasounds or other Referrals:  None Maternal Substance Abuse:  No Significant Maternal Medications:  None Significant Maternal Lab Results:  Rh neg Other Comments:  None  Review of Systems  HENT:       Mild  Gastrointestinal: Negative for heartburn.  Neurological: Positive for headaches.  All other systems reviewed and are negative.      Blood pressure 132/96, pulse 95, resp. rate 18, height 4' 11.75" (1.518 m), weight 68.493 kg (151 lb), SpO2 98.00%. Maternal Exam:  Uterine Assessment: Contraction frequency is rare.   Abdomen: Surgical scars: low transverse.   Fetal presentation: vertex  Introitus: Ferning test: not done.  Amniotic fluid character: not assessed.  Cervix: Cervix evaluated by digital exam.    Ve; ft/60/-3 Physical Exam  Constitutional: She is oriented to person, place, and time. She appears well-developed and well-nourished.  HENT:  Head: Normocephalic.  Eyes: EOM are normal.  Neck: Neck supple.  Cardiovascular: Regular rhythm.   Respiratory: Breath sounds normal.  GI: Bowel sounds are normal.   Musculoskeletal: She exhibits no edema.  Neurological: She is alert and oriented to person, place, and time.  Skin: Skin is warm and dry.  Psychiatric: She has a normal mood and affect.    Prenatal labs: ABO, Rh:  B neg Antibody:  neg Rubella:  Immune RPR:   NR HBsAg:   neg HIV:   Neg GBS:   pending  Assessment/Plan: PIH r/o preeclampsia Prev LTCS desires VBAC IUP @ 33 1/7 week Rh neg  P) admit. 24 hr urine collection Protein/crcl. Betamethasone. Hold magnesium sulfate. sono for EFW. Start labetalol. Serial BP. Daily weights cont fetal monitor. GBS cx done. PIH labs pending  Oneita Allmon A 04/14/2012, 12:42 AM

## 2012-04-14 NOTE — Plan of Care (Signed)
Problem: Consults Goal: Birthing Suites Patient Information Press F2 to bring up selections list Outcome: Completed/Met Date Met:  04/14/12  Pt < [redacted] weeks EGA and PIH (Pregnancy induced hypertension)

## 2012-04-15 DIAGNOSIS — O34219 Maternal care for unspecified type scar from previous cesarean delivery: Secondary | ICD-10-CM | POA: Diagnosis present

## 2012-04-15 LAB — CREATININE CLEARANCE, URINE, 24 HOUR
Collection Interval-CRCL: 24 hours
Creatinine Clearance: 138 mL/min — ABNORMAL HIGH (ref 75–115)
Creatinine, 24H Ur: 1136 mg/d (ref 700–1800)
Creatinine, Urine: 56.78 mg/dL
Creatinine: 0.57 mg/dL (ref 0.50–1.10)

## 2012-04-15 LAB — PROTEIN, URINE, 24 HOUR
Protein, 24H Urine: 100 mg/d (ref 50–100)
Protein, Urine: 5 mg/dL

## 2012-04-15 LAB — URINE CULTURE: Colony Count: 6000

## 2012-04-15 MED ORDER — LABETALOL HCL 100 MG PO TABS: 100.0000 mg | ORAL_TABLET | Freq: Two times a day (BID) | ORAL | Status: DC

## 2012-04-15 NOTE — Progress Notes (Signed)
S; denies h/a, visual changes. Denies feeling ctx  VS: Afebrile BP 121/70-140/79 Lungs: clear to A Cor RRR Abd; gravid nontender Extr(-)edema or calf tenderness  Tracing; reactive occ ctx  UTP 100mg /24 hr IMP: PIH on labetalol. BMZ complete Previous C/S P) d/c home  Cont labetalol. PIH warning signs. PTL precaution. OOW. F/u 3 days

## 2012-04-15 NOTE — Discharge Summary (Signed)
Obstetric Discharge Summary Reason for Admission: PIH, IUP @ 33 1/7 week Prenatal Procedures: NST and ultrasound Intrapartum Procedures: betamethasone Postpartum Procedures: n/a Complications-Operative and Postpartum: none Hemoglobin  Date Value Range Status  04/13/2012 10.6* 12.0 - 15.0 g/dL Final     HCT  Date Value Range Status  04/13/2012 31.2* 36.0 - 46.0 % Final    Physical Exam:  General: WDWN female in NAD Lungs clear to A Abd gravid nontender Extremity no edema or calf tenderness  HOSPITAL COURSE: pt was admitted to antepartum service. She was started on oral labetalol. PIH labs were done and was normal. 24 hour urine collection started: UTP 100mg /24 hr. BMZ completed 12/15, 12/16 Continuous fetal monitor showed irreg ctx, reactive NST. sono done showed AGA( 5lb 5 oz), nl AFI Previous LTCS. GBS cx pending Discharge Diagnoses: PIH, undelivered  Discharge Information: Date: 04/15/2012 Activity: OOW Diet: no added salt Medications: labetalol Condition: stable Instructions: PIH warning signs, PTL prec Discharge to: home Follow-up Information    Follow up with Dominico Rod A, MD. On 04/18/2012.   Contact information:   [address] [place] [address] [phone]          Newborn Data: This patient has no babies on file  Naylah Cork A 04/15/2012, 3:30 PM

## 2012-04-16 ENCOUNTER — Inpatient Hospital Stay (HOSPITAL_COMMUNITY)
Admission: AD | Admit: 2012-04-16 | Discharge: 2012-04-16 | Disposition: A | Discharge status: Home / Self Care | Payer: BC Managed Care – PPO | Source: Ambulatory Visit | Attending: Obstetrics and Gynecology | Admitting: Obstetrics and Gynecology

## 2012-04-16 ENCOUNTER — Encounter (HOSPITAL_COMMUNITY): Payer: Self-pay | Admitting: *Deleted

## 2012-04-16 DIAGNOSIS — O4700 False labor before 37 completed weeks of gestation, unspecified trimester: Secondary | ICD-10-CM | POA: Insufficient documentation

## 2012-04-16 LAB — TYPE AND SCREEN
ABO/RH(D): B NEG
Unit division: 0

## 2012-04-16 LAB — CULTURE, BETA STREP (GROUP B ONLY)

## 2012-04-16 NOTE — MAU Note (Signed)
Pt G2 P1 at 33.3wks, having contractions every 5-70min.  Pt was discharged from Antenatal Unit 12/17 for elevated BP.

## 2012-04-16 NOTE — MAU Note (Signed)
History     Chief Complaint  Patient presents with  . Contractions  31 yo [id number] married female now @ 33 3/[redacted] weeks gestation presents for evaluation of ctx. (+) FM. Pt was discharged from hosp 12/17 from [facility] evaluation and was given betamethasone   OB History    Grav Para Term Preterm Abortions TAB SAB Ect Mult Living   2 1 0 1      1      Past Medical History  Diagnosis Date  . Pregnancy induced hypertension   . Heart murmur     no medication    Past Surgical History  Procedure Date  . Cesarean section     History reviewed. No pertinent family history.  History  Substance Use Topics  . Smoking status: Never Smoker   . Smokeless tobacco: Never Used  . Alcohol Use: No    Allergies: No Known Allergies  Prescriptions prior to admission  Medication Sig Dispense Refill  . labetalol (NORMODYNE) 100 MG tablet Take 1 tablet (100 mg total) by mouth 2 (two) times daily.  60 tablet  4  . ranitidine (ZANTAC) 150 MG tablet Take 150 mg by mouth 2 (two) times daily.         Physical Exam   Blood pressure 123/88, pulse 102, temperature 97.8 F (36.6 C), temperature source Oral, resp. rate 18, height 5' (1.524 m), weight 68.04 kg (150 lb).  General appearance: alert, cooperative and no distress Abdomen: gravid soft nontender Pelvic: FT/60/-3 posterior ED Course  Tracing: baseline 135- 140 reactive irreg ctx  P) d/c home. PTL prec. Call for appt 12/20. Cont labetalol. Increase oral intake  MDM     Rush Salce A, MD 3:49 AM 04/16/2012

## 2012-04-30 NOTE — L&D Delivery Note (Signed)
Delivery Note At 8:38 PM a viable and healthy female was delivered via VBAC, Spontaneous (Presentation: ; Occiput Anterior).  APGAR:9 ,9 ; weight pending.   Placenta status: Intact, Spontaneous.  Cord:  with the following complications:none .  Cord pH: na  Anesthesia: Epidural  Episiotomy: None Lacerations: 2nd degree Suture Repair: 2.0 vicryl rapide Est. Blood Loss (mL): 300  Mom to postpartum.  Baby to nursery-stable.  Shamia Uppal J 05/15/2012, 8:49 PM

## 2012-05-15 ENCOUNTER — Encounter (HOSPITAL_COMMUNITY): Payer: Self-pay | Admitting: Anesthesiology

## 2012-05-15 ENCOUNTER — Encounter (HOSPITAL_COMMUNITY): Payer: Self-pay | Admitting: *Deleted

## 2012-05-15 ENCOUNTER — Inpatient Hospital Stay (HOSPITAL_COMMUNITY): Payer: BC Managed Care – PPO | Admitting: Anesthesiology

## 2012-05-15 ENCOUNTER — Inpatient Hospital Stay (HOSPITAL_COMMUNITY)
Admission: AD | Admit: 2012-05-15 | Discharge: 2012-05-17 | DRG: 372 | Disposition: A | Discharge status: Home / Self Care | Payer: BC Managed Care – PPO | Source: Ambulatory Visit | Attending: Obstetrics & Gynecology | Admitting: Obstetrics & Gynecology

## 2012-05-15 DIAGNOSIS — O4290 Premature rupture of membranes, unspecified as to length of time between rupture and onset of labor, unspecified weeks of gestation: Secondary | ICD-10-CM | POA: Diagnosis present

## 2012-05-15 DIAGNOSIS — O139 Gestational [pregnancy-induced] hypertension without significant proteinuria, unspecified trimester: Principal | ICD-10-CM | POA: Diagnosis present

## 2012-05-15 DIAGNOSIS — D509 Iron deficiency anemia, unspecified: Secondary | ICD-10-CM | POA: Diagnosis present

## 2012-05-15 DIAGNOSIS — O34219 Maternal care for unspecified type scar from previous cesarean delivery: Secondary | ICD-10-CM | POA: Diagnosis present

## 2012-05-15 DIAGNOSIS — O9902 Anemia complicating childbirth: Secondary | ICD-10-CM | POA: Diagnosis present

## 2012-05-15 HISTORY — DX: Benign neoplasm of connective and other soft tissue, unspecified: D21.9

## 2012-05-15 LAB — CBC
MCH: 24.8 pg — ABNORMAL LOW (ref 26.0–34.0)
MCHC: 32.8 g/dL (ref 30.0–36.0)
MCV: 75.6 fL — ABNORMAL LOW (ref 78.0–100.0)
MCV: 75.2 fL — ABNORMAL LOW (ref 78.0–100.0)
Platelets: 250 10*3/uL (ref 150–400)
Platelets: 215 10*3/uL (ref 150–400)
RBC: 4.27 MIL/uL (ref 3.87–5.11)
RDW: 14.2 % (ref 11.5–15.5)
WBC: 19.8 10*3/uL — ABNORMAL HIGH (ref 4.0–10.5)

## 2012-05-15 LAB — PROTEIN / CREATININE RATIO, URINE
Creatinine, Urine: 45.76 mg/dL
Protein Creatinine Ratio: 0.14 (ref 0.00–0.15)

## 2012-05-15 LAB — COMPREHENSIVE METABOLIC PANEL
ALT: 16 U/L (ref 0–35)
AST: 22 U/L (ref 0–37)
CO2: 21 mEq/L (ref 19–32)
Calcium: 8.9 mg/dL (ref 8.4–10.5)
Chloride: 104 mEq/L (ref 96–112)
Creatinine, Ser: 0.56 mg/dL (ref 0.50–1.10)
GFR calc Af Amer: >90 mL/min (ref >90)
GFR calc non Af Amer: >90 mL/min (ref >90)
Glucose, Bld: 73 mg/dL (ref 70–99)
Total Bilirubin: 0.3 mg/dL (ref 0.3–1.2)

## 2012-05-15 LAB — PREPARE RBC (CROSSMATCH)

## 2012-05-15 MED ORDER — FENTANYL 2.5 MCG/ML BUPIVACAINE 1/10 % EPIDURAL INFUSION (WH - ANES)
14.0000 mL/h | INTRAMUSCULAR | Status: DC
  Administered 2012-05-15: 12 mL/h via EPIDURAL
  Filled 2012-05-15: qty 125

## 2012-05-15 MED ORDER — LIDOCAINE HCL (PF) 1 % IJ SOLN
INTRAMUSCULAR | Status: DC | PRN
  Administered 2012-05-15: 4 mL
  Administered 2012-05-15: 30 mL
  Administered 2012-05-15 (×2): 4 mL

## 2012-05-15 MED ORDER — LANOLIN HYDROUS EX OINT: TOPICAL_OINTMENT | CUTANEOUS | Status: DC | PRN

## 2012-05-15 MED ORDER — ONDANSETRON HCL 4 MG PO TABS: 4.0000 mg | ORAL_TABLET | ORAL | Status: DC | PRN

## 2012-05-15 MED ORDER — WITCH HAZEL-GLYCERIN EX PADS: 1.0000 "application " | MEDICATED_PAD | CUTANEOUS | Status: DC | PRN

## 2012-05-15 MED ORDER — OXYTOCIN 40 UNITS IN LACTATED RINGERS INFUSION - SIMPLE MED
1.0000 m[IU]/min | INTRAVENOUS | Status: DC
  Administered 2012-05-15: 1 m[IU]/min via INTRAVENOUS

## 2012-05-15 MED ORDER — LABETALOL HCL 100 MG PO TABS
100.0000 mg | ORAL_TABLET | Freq: Every day | ORAL | Status: DC
  Filled 2012-05-15: qty 1

## 2012-05-15 MED ORDER — OXYTOCIN 40 UNITS IN LACTATED RINGERS INFUSION - SIMPLE MED
62.5000 mL/h | INTRAVENOUS | Status: DC
  Administered 2012-05-15: 62.5 mL/h via INTRAVENOUS
  Filled 2012-05-15: qty 1000

## 2012-05-15 MED ORDER — SIMETHICONE 80 MG PO CHEW: 80.0000 mg | CHEWABLE_TABLET | ORAL | Status: DC | PRN

## 2012-05-15 MED ORDER — PHENYLEPHRINE 40 MCG/ML (10ML) SYRINGE FOR IV PUSH (FOR BLOOD PRESSURE SUPPORT)
80.0000 ug | PREFILLED_SYRINGE | INTRAVENOUS | Status: DC | PRN
  Filled 2012-05-15: qty 5

## 2012-05-15 MED ORDER — TERBUTALINE SULFATE 1 MG/ML IJ SOLN: 0.2500 mg | Freq: Once | INTRAMUSCULAR | Status: DC | PRN

## 2012-05-15 MED ORDER — DIPHENHYDRAMINE HCL 25 MG PO CAPS: 25.0000 mg | ORAL_CAPSULE | Freq: Four times a day (QID) | ORAL | Status: DC | PRN

## 2012-05-15 MED ORDER — DIBUCAINE 1 % RE OINT: 1.0000 "application " | TOPICAL_OINTMENT | RECTAL | Status: DC | PRN

## 2012-05-15 MED ORDER — LACTATED RINGERS IV SOLN
500.0000 mL | Freq: Once | INTRAVENOUS | Status: AC
  Administered 2012-05-15: 1000 mL via INTRAVENOUS

## 2012-05-15 MED ORDER — BENZOCAINE-MENTHOL 20-0.5 % EX AERO: 1.0000 "application " | INHALATION_SPRAY | CUTANEOUS | Status: DC | PRN

## 2012-05-15 MED ORDER — ACETAMINOPHEN 325 MG PO TABS: 650.0000 mg | ORAL_TABLET | ORAL | Status: DC | PRN

## 2012-05-15 MED ORDER — IBUPROFEN 600 MG PO TABS: 600.0000 mg | ORAL_TABLET | Freq: Four times a day (QID) | ORAL | Status: DC | PRN

## 2012-05-15 MED ORDER — IBUPROFEN 600 MG PO TABS
600.0000 mg | ORAL_TABLET | Freq: Four times a day (QID) | ORAL | Status: DC
  Administered 2012-05-16 – 2012-05-17 (×6): 600 mg via ORAL
  Filled 2012-05-15 (×6): qty 1

## 2012-05-15 MED ORDER — SENNOSIDES-DOCUSATE SODIUM 8.6-50 MG PO TABS
2.0000 | ORAL_TABLET | Freq: Every day | ORAL | Status: DC
  Administered 2012-05-16: 2 via ORAL

## 2012-05-15 MED ORDER — ONDANSETRON HCL 4 MG/2ML IJ SOLN: 4.0000 mg | Freq: Four times a day (QID) | INTRAMUSCULAR | Status: DC | PRN

## 2012-05-15 MED ORDER — LIDOCAINE HCL (PF) 1 % IJ SOLN
30.0000 mL | INTRAMUSCULAR | Status: DC | PRN
  Filled 2012-05-15: qty 30

## 2012-05-15 MED ORDER — OXYCODONE-ACETAMINOPHEN 5-325 MG PO TABS: 1.0000 | ORAL_TABLET | ORAL | Status: DC | PRN

## 2012-05-15 MED ORDER — EPHEDRINE 5 MG/ML INJ: 10.0000 mg | INTRAVENOUS | Status: DC | PRN

## 2012-05-15 MED ORDER — CITRIC ACID-SODIUM CITRATE 334-500 MG/5ML PO SOLN: 30.0000 mL | ORAL | Status: DC | PRN

## 2012-05-15 MED ORDER — LACTATED RINGERS IV SOLN
INTRAVENOUS | Status: DC
  Administered 2012-05-15: 09:00:00 via INTRAVENOUS

## 2012-05-15 MED ORDER — LABETALOL HCL 100 MG PO TABS
100.0000 mg | ORAL_TABLET | Freq: Two times a day (BID) | ORAL | Status: DC
Start: 2012-05-15 — End: 2012-05-15
  Administered 2012-05-15: 100 mg via ORAL
  Filled 2012-05-15 (×3): qty 1

## 2012-05-15 MED ORDER — PHENYLEPHRINE 40 MCG/ML (10ML) SYRINGE FOR IV PUSH (FOR BLOOD PRESSURE SUPPORT): 80.0000 ug | PREFILLED_SYRINGE | INTRAVENOUS | Status: DC | PRN

## 2012-05-15 MED ORDER — ZOLPIDEM TARTRATE 5 MG PO TABS: 5.0000 mg | ORAL_TABLET | Freq: Every evening | ORAL | Status: DC | PRN

## 2012-05-15 MED ORDER — EPHEDRINE 5 MG/ML INJ
10.0000 mg | INTRAVENOUS | Status: DC | PRN
  Filled 2012-05-15: qty 4

## 2012-05-15 MED ORDER — PRENATAL MULTIVITAMIN CH
1.0000 | ORAL_TABLET | Freq: Every day | ORAL | Status: DC
  Filled 2012-05-15 (×2): qty 1

## 2012-05-15 MED ORDER — OXYTOCIN BOLUS FROM INFUSION: 500.0000 mL | INTRAVENOUS | Status: DC

## 2012-05-15 MED ORDER — ONDANSETRON HCL 4 MG/2ML IJ SOLN: 4.0000 mg | INTRAMUSCULAR | Status: DC | PRN

## 2012-05-15 MED ORDER — DIPHENHYDRAMINE HCL 50 MG/ML IJ SOLN: 12.5000 mg | INTRAMUSCULAR | Status: DC | PRN

## 2012-05-15 MED ORDER — TETANUS-DIPHTH-ACELL PERTUSSIS 5-2.5-18.5 LF-MCG/0.5 IM SUSP: 0.5000 mL | Freq: Once | INTRAMUSCULAR | Status: DC

## 2012-05-15 MED ORDER — LACTATED RINGERS IV SOLN: 500.0000 mL | INTRAVENOUS | Status: DC | PRN

## 2012-05-15 MED ORDER — OXYTOCIN 40 UNITS IN LACTATED RINGERS INFUSION - SIMPLE MED
INTRAVENOUS | Status: AC
Start: 2012-05-15 — End: 2012-05-15
  Administered 2012-05-15: 40 [IU]
  Filled 2012-05-15: qty 1000

## 2012-05-15 NOTE — Progress Notes (Signed)
Carolyn Galloway is a 32 y.o. G2P0101 at [redacted]w[redacted]d by LMP admitted for rupture of membranes  Subjective: Still desires to pursue VBAC Comfortable with epidural  Objective: BP 110/80  Pulse 81  Temp 97.9 F (36.6 C) (Axillary)  Resp 18  Ht 4\' 11"  (1.499 m)  Wt 72.122 kg (159 lb)  BMI 32.11 kg/m2   Total I/O In: -  Out: 300 [Urine:300]  FHT:  FHR: 145 bpm, variability: moderate,  accelerations:  Present,  decelerations:  Present occ early UC:   regular, every 2-4 minutes SVE:   Dilation: 5 Effacement (%): 70 Station: -2 Exam by:: Dt Carols Clemence IUPC placed  Labs: Lab Results  Component Value Date   WBC 13.0* 05/15/2012   HGB 11.2* 05/15/2012   HCT 34.1* 05/15/2012   MCV 75.6* 05/15/2012   PLT 250 05/15/2012    Assessment / Plan: Augmentation of labor, progressing well TOLAC HTN - stable  Labor: Progressing normally Preeclampsia:  no signs or symptoms of toxicity, intake and ouput balanced and labs stable Fetal Wellbeing:  Category I Pain Control:  Epidural I/D:  n/a Anticipated MOD:  csec vs VBAC  Carolyn Galloway J 05/15/2012, 4:53 PM

## 2012-05-15 NOTE — MAU Note (Signed)
Did not take BP med this morning.  Had PIH with first

## 2012-05-15 NOTE — Progress Notes (Signed)
Alletta Mattos is a 32 y.o. G2P0101 at [redacted]w[redacted]d by LMP admitted for rupture of membranes  Subjective: Feels pressure  Objective: BP 134/88  Pulse 95  Temp 97.7 F (36.5 C) (Oral)  Resp 18  Ht 4\' 11"  (1.499 m)  Wt 72.122 kg (159 lb)  BMI 32.11 kg/m2 I/O last 3 completed shifts: In: -  Out: 300 [Urine:300]    FHT:  FHR: 145 bpm, variability: moderate,  accelerations:  Present,  decelerations:  Absent UC:   regular, every 3 minutes SVE:   10/100/+2  Labs: Lab Results  Component Value Date   WBC 13.0* 05/15/2012   HGB 11.2* 05/15/2012   HCT 34.1* 05/15/2012   MCV 75.6* 05/15/2012   PLT 250 05/15/2012    Assessment / Plan: Augmentation of labor, progressing well CHTN stable TOLAC with good progress  Labor: Progressing normally Preeclampsia:  no signs or symptoms of toxicity, intake and ouput balanced and labs stable Fetal Wellbeing:  Category I Pain Control:  Epidural I/D:  n/a Anticipated MOD:  NSVD(VBAC)  Blondina Coderre J 05/15/2012, 8:06 PM

## 2012-05-15 NOTE — H&P (Signed)
Carolyn Galloway is a 32 y.o. female presenting at 37.4 wks with SROM at 7 am. Clear fluid, no bleeding or meconeum. Desired VBAC.  She is G1P0101, prior 33 wks c/s was LTCS at [facility] for failure to dilate at IOL for severe PEC. [facility]- Dr Cherly Hensen. Pregnancy complicated by late onset gestational HTN, was admitted on 04/14/12 for HTN and labs and 24 hr urine protein nl, was started on Labetalol 100mg  bid. BP well controlled on this low dose. ANtesting/ interval fetal growth normal, last sono 05/05/12 AGA at 6'8"     Maternal Medical History:  Reason for admission: Reason for admission: rupture of membranes.  Fetal activity: Perceived fetal activity is normal.    Prenatal complications: Hypertension.   No bleeding, IUGR, pre-eclampsia or preterm labor.   Prenatal Complications - Diabetes: none.    OB History    Grav Para Term Preterm Abortions TAB SAB Ect Mult Living   2 1 0 1      1     Past Medical History  Diagnosis Date  . Pregnancy induced hypertension   . Heart murmur     no medication  . Fibroid   . Preterm labor    Past Surgical History  Procedure Date  . Cesarean section    Family History: family history is not on file. Social History:  reports that she has never smoked. She has never used smokeless tobacco. She reports that she does not drink alcohol or use illicit drugs.   Prenatal Transfer Tool  Maternal Diabetes: No Genetic Screening: Declined Maternal Ultrasounds/Referrals: Normal Fetal Ultrasounds or other Referrals:  None Maternal Substance Abuse:  No Significant Maternal Medications:  Meds include: Other:  Labetalol, Zantac Significant Maternal Lab Results:  Lab values include: Group B Strep negative, Rh negative, s/p Rhogam  Review of Systems  Constitutional: Negative for fever.  Eyes: Negative for blurred vision.  Respiratory: Negative for cough.   Cardiovascular: Negative for chest pain.  Skin: Negative for rash.  Neurological: Negative for dizziness and  headaches.  Psychiatric/Behavioral: Negative for depression.      Blood pressure 146/98, pulse 94, temperature 97.8 F (36.6 C), temperature source Oral, resp. rate 18, height 4\' 11"  (1.499 m), weight 159 lb (72.122 kg). Exam Physical Exam  A&O x 3, no acute distress. Pleasant HEENT neg, no thyromegaly Lungs CTA bilat CV RRR, S1S2 normal Abdo soft, non tender, non acute Extr no edema/ tenderness. DTR nl +2 Pelvic Cx 2/ long 50% effaced/ Vtx, stn high at -5; unengaged, Android pelvic with narrow subpubic arch.Copious clear AF noted.  FHT  140s/ + accels/ no decels/ moderate variability- category I Toco irreg, rare   Prenatal labs: ABO, Rh: --/--/B NEG (12/15 2351), took Rhogam Antibody: Negative (01/16 0859) Rubella: Immune (07/11 0000) RPR: NON REACTIVE (12/15 2351)  HBsAg: Negative (07/11 0000)  HIV: Non-reactive (07/11 0000)  GBS: Negative (01/16 0857)  Genetic screening - declined Anatomy sono - normal with good interval growth. Last sono on 1/6, baby Vtx, 6'8" at 36 wks at 78% NST reactive and BPP 8/8.    Assessment/Plan: 32 yo, G2P0101, at 37.4 wks with SROM at 7 am, clear fluid. Rare UCs, no bleeding and good FMs. BP elevated (missed AM Labetalol). H/o severe PEC, now GHTN, recent nl 24 hr urine protein at 100mg  and normal labs in Dec'13.  Plan repeating PIH labs, urine P/C ratio. Closely monitor BP, will cont PO meds for now.  If UCs don't pick up, recommend cervical foley bulb with  low dose pitocin since VBAC desired. Slightly increased risk of uterine scar rupture reviewed. Considering narrow pelvic and high presenting part she is more than likely to fail TOLAC and she understands. Pain mngmt options reviewed. Desired Epidural when ready.  Dr Cherly Hensen is not available today, patient was advised that on call MDs will take care of her and she is in agreement.     Jeneva Schweizer R 05/15/2012, 10:16 AM

## 2012-05-15 NOTE — Anesthesia Preprocedure Evaluation (Signed)
Anesthesia Evaluation  Patient identified by MRN, date of birth, ID band Patient awake    Reviewed: Allergy & Precautions, H&P , NPO status , Patient's Chart, lab work & pertinent test results, reviewed documented beta blocker date and time   History of Anesthesia Complications Negative for: history of anesthetic complications  Airway Mallampati: III TM Distance: >3 FB Neck ROM: full    Dental  (+) Teeth Intact   Pulmonary neg pulmonary ROS,  breath sounds clear to auscultation        Cardiovascular hypertension (severe preeclampsia), On Home Beta Blockers + Valvular Problems/Murmurs Rhythm:regular Rate:Normal     Neuro/Psych negative neurological ROS  negative psych ROS   GI/Hepatic Neg liver ROS, GERD-  Medicated,  Endo/Other  negative endocrine ROS  Renal/GU negative Renal ROS     Musculoskeletal   Abdominal   Peds  Hematology negative hematology ROS (+)   Anesthesia Other Findings   Reproductive/Obstetrics (+) Pregnancy (h/o c/s x1)                           Anesthesia Physical Anesthesia Plan  ASA: III  Anesthesia Plan: Epidural   Post-op Pain Management:    Induction:   Airway Management Planned:   Additional Equipment:   Intra-op Plan:   Post-operative Plan:   Informed Consent: I have reviewed the patients History and Physical, chart, labs and discussed the procedure including the risks, benefits and alternatives for the proposed anesthesia with the patient or authorized representative who has indicated his/her understanding and acceptance.     Plan Discussed with:   Anesthesia Plan Comments:         Anesthesia Quick Evaluation

## 2012-05-15 NOTE — MAU Note (Signed)
Started leaking at 0730, clear fluid. Small amt of blood. Irregular ctx's.  Last wk was 1 cm.

## 2012-05-15 NOTE — Progress Notes (Signed)
Patient ID: Carolyn Galloway, female   DOB: Sep 26, 1980, 32 y.o.   MRN: [id number]  Subjective: Doing well, no pain, feels some UCs, very mild. No bleeding. Leaking continues. PO Labetalol ordered. PIH labs are back and normal, urine pending.   Objective: BP 121/84  Pulse 79  Temp 97.9 F (36.6 C) (Axillary)  Resp 18  Ht 4\' 11"  (1.499 m)  Wt 159 lb (72.122 kg)  BMI 32.11 kg/m2  FHT:  FHR: 140 bpm, variability: moderate,  accelerations:  Present,  decelerations:  Absent UC:   irregular, every 6-8 minutes SVE:   Dilation: 2 Effacement (%): Thick Station: Ballotable;-3 Exam by:: Dr Juliene Pina  Foley bulb placed above the internal os and traction applied, pt tolerated it well.   Assessment / Plan: 37.4 wks, SROM at 7 am, 4 hrs since with no significant UCs. Will start low dose pitocin and assess labor progress. FHT category I PIH labs nl, Urine p/c ratio pending.  Dr Billy Coast on call at 1 pm, pt informed, she'll consult her husband let us know if ok with female provider.   V.Zabian Swayne, MD

## 2012-05-15 NOTE — Anesthesia Procedure Notes (Signed)
Epidural Patient location during procedure: OB Start time: 05/15/2012 3:05 PM  Staffing Performed by: anesthesiologist   Preanesthetic Checklist Completed: patient identified, site marked, surgical consent, pre-op evaluation, timeout performed, IV checked, risks and benefits discussed and monitors and equipment checked  Epidural Patient position: sitting Prep: site prepped and draped and DuraPrep Patient monitoring: continuous pulse ox and blood pressure Approach: midline Injection technique: LOR air  Needle:  Needle type: Tuohy  Needle gauge: 17 G Needle length: 9 cm and 9 Needle insertion depth: 6 cm Catheter type: closed end flexible Catheter size: 19 Gauge Catheter at skin depth: 11 cm Test dose: negative  Assessment Events: blood not aspirated, injection not painful, no injection resistance, negative IV test and no paresthesia  Additional Notes Discussed risk of headache, infection, bleeding, nerve injury and failed or incomplete block.  Patient voices understanding and wishes to proceed.  Epidural placed on first attempt.  No paresthesia.  Patient tolerated procedure well with no apparent complications.  Jasmine December, MD Reason for block:procedure for pain

## 2012-05-15 NOTE — MAU Note (Signed)
Soaked 2nd pad

## 2012-05-16 DIAGNOSIS — O34219 Maternal care for unspecified type scar from previous cesarean delivery: Secondary | ICD-10-CM | POA: Diagnosis not present

## 2012-05-16 DIAGNOSIS — O99019 Anemia complicating pregnancy, unspecified trimester: Secondary | ICD-10-CM | POA: Diagnosis present

## 2012-05-16 DIAGNOSIS — D509 Iron deficiency anemia, unspecified: Secondary | ICD-10-CM | POA: Diagnosis present

## 2012-05-16 LAB — CBC
HCT: 27.1 % — ABNORMAL LOW (ref 36.0–46.0)
Hemoglobin: 9 g/dL — ABNORMAL LOW (ref 12.0–15.0)
MCHC: 33.2 g/dL (ref 30.0–36.0)
RBC: 3.6 MIL/uL — ABNORMAL LOW (ref 3.87–5.11)

## 2012-05-16 MED ORDER — RHO D IMMUNE GLOBULIN 1500 UNIT/2ML IJ SOLN
300.0000 ug | Freq: Once | INTRAMUSCULAR | Status: AC
  Administered 2012-05-16: 300 ug via INTRAMUSCULAR
  Filled 2012-05-16: qty 2

## 2012-05-16 MED ORDER — POLYSACCHARIDE IRON COMPLEX 150 MG PO CAPS
150.0000 mg | ORAL_CAPSULE | Freq: Every day | ORAL | Status: DC
  Administered 2012-05-16 – 2012-05-17 (×2): 150 mg via ORAL
  Filled 2012-05-16 (×2): qty 1

## 2012-05-16 MED ORDER — LABETALOL HCL 100 MG PO TABS
100.0000 mg | ORAL_TABLET | Freq: Every day | ORAL | Status: DC
  Filled 2012-05-16: qty 1

## 2012-05-16 NOTE — Anesthesia Postprocedure Evaluation (Signed)
  Anesthesia Post-op Note  Patient: Carolyn Galloway  Procedure(s) Performed: * No procedures listed *  Patient LocaComplicationAnesthesia Post Note  Patient: Carolyn Galloway  Procedure(s) Performed: * No procedures listed *  Anesthesia type: Epidural  Patient location: Mother/Baby  Post pain: Pain level controlled  Post assessment: Post-op Vital signs reviewed  Last Vitals:  Filed Vitals:   05/16/12 0400  BP: 109/58  Pulse: 85  Temp: 36.8 C  Resp: 18    Post vital signs: Reviewed  Level of consciousness:alert  Complications: No apparent anesthesia complications

## 2012-05-16 NOTE — Progress Notes (Addendum)
PPD 1 SVD / VBAC  S:  Reports feeling well             Tolerating po/ No nausea or vomiting             Bleeding is light this am - heavier last PM - continued on pitocin IVF during night             Pain controlled with motrin             Up ad lib / ambulatory  Newborn breast feeding  / Circumcision planned O:               VS: BP 109/58  Pulse 85  Temp 98.3 F (36.8 C) (Oral)  Resp 18  Ht 4\' 11"  (1.499 m)  Wt 72.122 kg (159 lb)  BMI 32.11 kg/m2  SpO2 95%  Breastfeeding? Unknown   LABS:  Basename 05/16/12 0520 05/15/12 2225  WBC 18.0* 19.8*  HGB 9.0* 10.8*  PLT 221 215                                    I&O:   -1300              Physical Exam:             Alert and oriented X3  Lungs: Clear and unlabored  Heart: regular rate and rhythm / no mumurs  Abdomen: soft, non-tender, non-distended              Fundus: firm, non-tender, U-1  Perineum: mild edema - ice pack in place  Lochia: light  Extremities: no edema, no calf pain or tenderness    A: PPD # 1 SVD / VBAC             PIH - stable / low BP postpartum             IDA compounded by ABL postpartum ([facility] floor)   Doing well - stable status  P:  Routine post partum orders             Hold labetalol if BP LESS than 130/90  Start iron supplement in addition to PNV  Marlinda Mike CNM, MSN 05/16/2012, 9:34 AM

## 2012-05-17 LAB — RH IG WORKUP (INCLUDES ABO/RH)
ABO/RH(D): B NEG
Fetal Screen: NEGATIVE
Unit division: 0

## 2012-05-17 MED ORDER — IBUPROFEN 600 MG PO TABS: 600.0000 mg | ORAL_TABLET | Freq: Four times a day (QID) | ORAL | Status: DC

## 2012-05-17 MED ORDER — POLYSACCHARIDE IRON COMPLEX 150 MG PO CAPS: 150.0000 mg | ORAL_CAPSULE | Freq: Every day | ORAL | Status: DC

## 2012-05-17 MED ORDER — OXYCODONE-ACETAMINOPHEN 5-325 MG PO TABS: 1.0000 | ORAL_TABLET | ORAL | Status: DC | PRN

## 2012-05-17 NOTE — Progress Notes (Signed)
Patient ID: Carolyn Galloway, female   DOB: [birth year], 32 y.o.   MRN: [id number]  PPD 2 SVD / VBAC  S:  Reports feeling ok             Tolerating po/ No nausea or vomiting             Bleeding is light             Pain controlled with motrin and percocet             Up ad lib / ambulatory  Newborn breast feeding  / Circumcision done  O:               VS: BP 107/67  Pulse 89  Temp 97.9 F (36.6 C) (Oral)  Resp 18  Ht 4\' 11"  (1.499 m)  Wt 72.122 kg (159 lb)  BMI 32.11 kg/m2  SpO2 95%  Breastfeeding? Unknown  BP stable - no labetalol since delivery    LABS:  Basename 05/16/12 0520 05/15/12 2225  WBC 18.0* 19.8*  HGB 9.0* 10.8*  PLT 221 215                               Physical Exam:             Alert and oriented X3  Abdomen: soft, non-tender, non-distended              Fundus: firm, non-tender, U-1  Perineum: no edeam  Lochia: light  Extremities: no edema, no calf pain or tenderness   A: PPD # 2 VBAC             PIH - stable BP without medication             IDA of pregnancy   Doing well - stable status  P:  Routine post partum orders  Discharge to home             BP check in the office in 1 week             Iron x 6-8 weeks - check Hgb and Ferritin at 6 week apt             WOB instructions reviewed per booklet  Marlinda Mike CNM, MSN 05/17/2012, 9:50 AM

## 2012-05-17 NOTE — Discharge Summary (Signed)
Obstetric Discharge Summary  Reason for Admission:, SROM, onset of labor / mild PIH / iron deficiency anemia of pregnancy, previous cesarean secton Prenatal Procedures: ultrasound, NST Intrapartum Procedures: spontaneous vaginal delivery / TOLAC to successful VBAC Postpartum Procedures: none Complications-Operative and Postpartum: 2nd degree perineal laceration Hemoglobin  Date Value Range Status  05/16/2012 9.0* 12.0 - 15.0 g/dL Final     HCT  Date Value Range Status  05/16/2012 27.1* 36.0 - 46.0 % Final    Physical Exam:  General: alert, cooperative and no distress Lochia: appropriate Uterine Fundus: firm Incision: healing well DVT Evaluation: No evidence of DVT seen on physical exam.  Discharge Diagnoses: Term Pregnancy-delivered / VBAC / PIH - delivered / IDA anemia  Discharge Information: Date: 05/17/2012 Activity: pelvic rest Diet: routine Medications: PNV, Ibuprofen, Colace, Iron and Percocet Condition: stable Instructions: refer to practice specific booklet Discharge to: home Follow-up:  [facility] in 1 week for BP check  [facility] at 6 weeks for postpartum exam and Labs                                                               Newborn Data: Live born female  Birth Weight: 6 lb 6.1 oz (2895 g) APGAR: 9, 9  Home with mother.  Marlinda Mike 05/17/2012, 9:59 AM

## 2012-05-19 LAB — TYPE AND SCREEN
DAT, IgG: NEGATIVE
Unit division: 0

## 2013-03-03 LAB — OB RESULTS CONSOLE RPR: RPR: NONREACTIVE

## 2013-03-03 LAB — OB RESULTS CONSOLE HIV ANTIBODY (ROUTINE TESTING): HIV: NONREACTIVE

## 2013-03-03 LAB — OB RESULTS CONSOLE HEPATITIS B SURFACE ANTIGEN: HEP B S AG: NEGATIVE

## 2013-03-03 LAB — OB RESULTS CONSOLE ABO/RH: RH Type: NEGATIVE

## 2013-03-03 LAB — OB RESULTS CONSOLE ANTIBODY SCREEN: ANTIBODY SCREEN: NEGATIVE

## 2013-03-03 LAB — OB RESULTS CONSOLE RUBELLA ANTIBODY, IGM: Rubella: IMMUNE

## 2013-03-12 LAB — OB RESULTS CONSOLE GC/CHLAMYDIA
CHLAMYDIA, DNA PROBE: NEGATIVE
Gonorrhea: NEGATIVE

## 2013-04-30 NOTE — L&D Delivery Note (Signed)
Delivery Note At 3:56 AM a viable and healthy female was delivered via Vaginal, Spontaneous Delivery (Presentation: Right Occiput Anterior).  APGAR: 8, 9; weight .  pending Placenta status: Intact, Spontaneous.  Not sent Cord: 3 vessels with the following complications: CAN x 1 reducible None.  Cord pH: none  Anesthesia: Epidural  Episiotomy: None Lacerations: 1st degree;Perineal Suture Repair: 3.0 chromic Est. Blood Loss (mL): 250  Mom to postpartum.  Baby to Couplet care / Skin to Skin.  Carolyn Galloway A Miata Culbreth 09/21/2013, 4:20 AM

## 2013-07-15 ENCOUNTER — Encounter (HOSPITAL_COMMUNITY): Payer: Self-pay | Admitting: *Deleted

## 2013-07-15 ENCOUNTER — Inpatient Hospital Stay (HOSPITAL_COMMUNITY)
Admission: AD | Admit: 2013-07-15 | Discharge: 2013-07-15 | Disposition: A | Discharge status: Home / Self Care | Payer: BC Managed Care – PPO | Source: Ambulatory Visit | Attending: Obstetrics and Gynecology | Admitting: Obstetrics and Gynecology

## 2013-07-15 DIAGNOSIS — R51 Headache: Secondary | ICD-10-CM | POA: Insufficient documentation

## 2013-07-15 DIAGNOSIS — O99019 Anemia complicating pregnancy, unspecified trimester: Secondary | ICD-10-CM | POA: Insufficient documentation

## 2013-07-15 DIAGNOSIS — R42 Dizziness and giddiness: Secondary | ICD-10-CM | POA: Insufficient documentation

## 2013-07-15 DIAGNOSIS — O139 Gestational [pregnancy-induced] hypertension without significant proteinuria, unspecified trimester: Secondary | ICD-10-CM | POA: Insufficient documentation

## 2013-07-15 DIAGNOSIS — D509 Iron deficiency anemia, unspecified: Secondary | ICD-10-CM | POA: Insufficient documentation

## 2013-07-15 LAB — CBC
HEMATOCRIT: 30.6 % — AB (ref 36.0–46.0)
HEMOGLOBIN: 10.7 g/dL — AB (ref 12.0–15.0)
MCH: 27.7 pg (ref 26.0–34.0)
MCHC: 35 g/dL (ref 30.0–36.0)
MCV: 79.3 fL (ref 78.0–100.0)
Platelets: 219 10*3/uL (ref 150–400)
RBC: 3.86 MIL/uL — AB (ref 3.87–5.11)
RDW: 12.8 % (ref 11.5–15.5)
WBC: 11.7 10*3/uL — ABNORMAL HIGH (ref 4.0–10.5)

## 2013-07-15 LAB — COMPREHENSIVE METABOLIC PANEL
ALK PHOS: 111 U/L (ref 39–117)
ALT: 10 U/L (ref 0–35)
AST: 16 U/L (ref 0–37)
Albumin: 2.7 g/dL — ABNORMAL LOW (ref 3.5–5.2)
BILIRUBIN TOTAL: 0.2 mg/dL — AB (ref 0.3–1.2)
BUN: 7 mg/dL (ref 6–23)
CHLORIDE: 105 meq/L (ref 96–112)
CO2: 20 meq/L (ref 19–32)
Calcium: 8.6 mg/dL (ref 8.4–10.5)
Creatinine, Ser: 0.46 mg/dL — ABNORMAL LOW (ref 0.50–1.10)
GLUCOSE: 74 mg/dL (ref 70–99)
POTASSIUM: 4.3 meq/L (ref 3.7–5.3)
SODIUM: 137 meq/L (ref 137–147)
Total Protein: 6.2 g/dL (ref 6.0–8.3)

## 2013-07-15 LAB — URINE MICROSCOPIC-ADD ON

## 2013-07-15 LAB — URINALYSIS, ROUTINE W REFLEX MICROSCOPIC
BILIRUBIN URINE: NEGATIVE
Glucose, UA: NEGATIVE mg/dL
HGB URINE DIPSTICK: NEGATIVE
KETONES UR: NEGATIVE mg/dL
NITRITE: NEGATIVE
Protein, ur: NEGATIVE mg/dL
SPECIFIC GRAVITY, URINE: 1.01 (ref 1.005–1.030)
Urobilinogen, UA: 0.2 mg/dL (ref 0.0–1.0)
pH: 7 (ref 5.0–8.0)

## 2013-07-15 LAB — URIC ACID: Uric Acid, Serum: 2.8 mg/dL (ref 2.4–7.0)

## 2013-07-15 MED ORDER — BETAMETHASONE SOD PHOS & ACET 6 (3-3) MG/ML IJ SUSP
12.0000 mg | Freq: Once | INTRAMUSCULAR | Status: AC
  Administered 2013-07-15: 12 mg via INTRAMUSCULAR
  Filled 2013-07-15: qty 2

## 2013-07-15 MED ORDER — POLYSACCHARIDE IRON COMPLEX 150 MG PO CAPS: 150.0000 mg | ORAL_CAPSULE | Freq: Every day | ORAL | Status: DC

## 2013-07-15 MED ORDER — POLYSACCHARIDE IRON COMPLEX 150 MG PO CAPS
150.0000 mg | ORAL_CAPSULE | Freq: Once | ORAL | Status: DC
  Filled 2013-07-15: qty 1

## 2013-07-15 MED ORDER — ACETAMINOPHEN 500 MG PO TABS
1000.0000 mg | ORAL_TABLET | Freq: Once | ORAL | Status: AC
  Administered 2013-07-15: 1000 mg via ORAL
  Filled 2013-07-15: qty 2

## 2013-07-15 NOTE — MAU Note (Signed)
Pt states she has had headache all day.  Pt states she saw spots last night

## 2013-07-15 NOTE — Discharge Instructions (Signed)
Hypertension During Pregnancy Hypertension is also called high blood pressure. It can occur at any time in life and during pregnancy. When you have hypertension, there is extra pressure inside your blood vessels that carry blood from the heart to the rest of your body (arteries). Hypertension during pregnancy can cause problems for you and your baby. Your baby might not weigh as much as it should at birth or might be born early (premature). Very bad cases of hypertension during pregnancy can be life threatening.  Different types of hypertension can occur during pregnancy.   Chronic hypertension. This happens when a woman has hypertension before pregnancy and it continues during pregnancy.  Gestational hypertension. This is when hypertension develops during pregnancy.  Preeclampsia or toxemia of pregnancy. This is a very serious type of hypertension that develops only during pregnancy. It is a disease that affects the whole body (systemic) and can be very dangerous for both mother and baby.  Gestational hypertension and preeclampsia usually go away after your baby is born. Blood pressure generally stabilizes within 6 weeks. Women who have hypertension during pregnancy have a greater chance of developing hypertension later in life or with future pregnancies. RISK FACTORS Some factors make you more likely to develop hypertension during pregnancy. Risk factors include:  Having hypertension before pregnancy.  Having hypertension during a previous pregnancy.  Being overweight.  Being older than 40.  Being pregnant with more than one baby (multiples).  Having diabetes or kidney problems. SIGNS AND SYMPTOMS Chronic and gestational hypertension rarely cause symptoms. Preeclampsia has symptoms, which may include:  Increased protein in your urine. Your health care provider will check for this at every prenatal visit.  Swelling of your hands and face.  Rapid weight gain.  Headaches.  Visual  changes.  Being bothered by light.  Abdominal pain, especially in the right upper area.  Chest pain.  Shortness of breath.  Increased reflexes.  Seizures. Seizures occur with a more severe form of preeclampsia, called eclampsia. DIAGNOSIS   You may be diagnosed with hypertension during a regular prenatal exam. At each visit, tests may include:  Blood pressure checks.  A urine test to check for protein in your urine.  The type of hypertension you are diagnosed with depends on when you developed it. It also depends on your specific blood pressure reading.  Developing hypertension before 20 weeks of pregnancy is consistent with chronic hypertension.  Developing hypertension after 20 weeks of pregnancy is consistent with gestational hypertension.  Hypertension with increased urinary protein is diagnosed as preeclampsia.  Blood pressure measurements that stay above 160 systolic or 110 diastolic are a sign of severe preeclampsia. TREATMENT Treatment for hypertension during pregnancy varies. Treatment depends on the type of hypertension and how serious it is.  If you take medicine for chronic hypertension, you may need to switch medicines.  Drugs called ACE inhibitors should not be taken during pregnancy.  Low-dose aspirin may be suggested for women who have risk factors for preeclampsia.  If you have gestational hypertension, you may need to take a blood pressure medicine that is safe during pregnancy. Your health care provider will recommend the appropriate medicine.  If you have severe preeclampsia, you may need to be in the hospital. Health care providers will watch you and the baby very closely. You also may need to take medicine (magnesium sulfate) to prevent seizures and lower blood pressure.  Sometimes an early delivery is needed. This may be the case if the condition worsens. It would   be done to protect you and the baby. The only cure for preeclampsia is delivery. HOME  CARE INSTRUCTIONS  Schedule and keep all of your regular appointments for prenatal care.  Only take over-the-counter or prescription medicines as directed by your health care provider. Tell your health care provider about all medicines you take.  Eat as little salt as possible.  Get regular exercise.  Do not drink alcohol.  Do not use tobacco products.  Do not drink products with caffeine.  Lie on your left side when resting. SEEK IMMEDIATE MEDICAL CARE IF:  You have severe abdominal pain.  You have sudden swelling in the hands, ankles, or face.  You gain 4 pounds (1.8 kg) or more in 1 week.  You vomit repeatedly.  You have vaginal bleeding.  You do not feel the baby moving as much.  You have a headache.  You have blurred or double vision.  You have muscle twitching or spasms.  You have shortness of breath.  You have blue fingernails and lips.  You have blood in your urine. MAKE SURE YOU:  Understand these instructions.  Will watch your condition.  Will get help right away if you are not doing well or get worse. Document Released: 01/02/2011 Document Revised: 02/04/2013 Document Reviewed: 11/13/2012 [employer] Patient Information 2014 [employer].  

## 2013-07-15 NOTE — MAU Note (Signed)
Sent from MD office with elevated BP, dizziness & seeing spots yesterday, HA today.  Swelling in feet & face.  Denies bleeding or LOF.

## 2013-07-15 NOTE — Progress Notes (Signed)
Last night pt states she had some blurred vision

## 2013-07-15 NOTE — MAU Provider Note (Signed)
History     CSN: [id number]  Arrival date and time: 07/15/13 1541   None     Chief Complaint  Patient presents with  . Headache  . Hypertension   HPI Comments: [id number] @29 .3 wks by sono sent from office for elevated bp's and HA. Some dizziness yesterday, HA started last night with some photophobia. Today HA only persisted. No epigastric pain. Good FM, no LOF, VB, or ctx. H/o CS for PEC @32  wks, and VBAC @37  wks with gHTN.  Headache  This is a new problem. The current episode started yesterday. The pain is located in the vertex region. The quality of the pain is described as aching. The pain is moderate. Associated symptoms include photophobia. Pertinent negatives include no blurred vision, nausea or visual change. She has tried nothing for the symptoms. Her past medical history is significant for hypertension.  Hypertension Associated symptoms include headaches. Pertinent negatives include no blurred vision.    OB History   Grav Para Term Preterm Abortions TAB SAB Ect Mult Living   3 2 1 1      2       Past Medical History  Diagnosis Date  . Pregnancy induced hypertension   . Heart murmur     no medication  . Fibroid   . Preterm labor     Past Surgical History  Procedure Laterality Date  . Cesarean section      History reviewed. No pertinent family history.  History  Substance Use Topics  . Smoking status: Never Smoker   . Smokeless tobacco: Never Used  . Alcohol Use: No    Allergies: No Known Allergies  No prescriptions prior to admission    Review of Systems  Constitutional: Negative.   Eyes: Positive for photophobia. Negative for blurred vision.  Respiratory: Negative.   Cardiovascular: Positive for leg swelling.       Trace hands, feet, face  Gastrointestinal: Negative for nausea.  Genitourinary: Negative.   Musculoskeletal: Negative.   Neurological: Positive for headaches.  Endo/Heme/Allergies: Negative.   Psychiatric/Behavioral: Negative.     Physical Exam   Blood pressure 117/82, pulse 86, temperature 98.4 F (36.9 C), resp. rate 20, height 5' (1.524 m), weight 65.318 kg (144 lb), unknown if currently breastfeeding.  Physical Exam  Constitutional: She is oriented to person, place, and time. She appears well-developed and well-nourished.  HENT:  Head: Normocephalic.  Neck: Normal range of motion.  Cardiovascular: Normal rate and regular rhythm.   Respiratory: Effort normal and breath sounds normal.  GI: Soft. Bowel sounds are normal. She exhibits no distension. There is no tenderness.  gravid  Genitourinary:  deferred  Musculoskeletal: Normal range of motion.  Neurological: She is alert and oriented to person, place, and time. She has normal reflexes.  No clonus  Skin: Skin is warm and dry.  Psychiatric: She has a normal mood and affect.   Efm: 130bpm, moderate variability, accels present, no decels Toco: none NST-reactive  Recent Results (from the past 2160 hour(s))  CBC     Status: Abnormal   Collection Time    07/15/13  4:28 PM      Result Value Ref Range   WBC 11.7 (*) 4.0 - 10.5 K/uL   RBC 3.86 (*) 3.87 - 5.11 MIL/uL   Hemoglobin 10.7 (*) 12.0 - 15.0 g/dL   HCT 30.6 (*) 36.0 - 46.0 %   MCV 79.3  78.0 - 100.0 fL   MCH 27.7  26.0 - 34.0 pg   MCHC 35.0  30.0 - 36.0 g/dL   RDW 12.8  11.5 - 15.5 %   Platelets 219  150 - 400 K/uL  COMPREHENSIVE METABOLIC PANEL     Status: Abnormal (Preliminary result)   Collection Time    07/15/13  4:28 PM      Result Value Ref Range   Sodium 137  137 - 147 mEq/L   Potassium 4.3  3.7 - 5.3 mEq/L   Chloride 105  96 - 112 mEq/L   CO2 20  19 - 32 mEq/L   Glucose, Bld 74  70 - 99 mg/dL   BUN 7  6 - 23 mg/dL   Creatinine, Ser 0.46 (*) 0.50 - 1.10 mg/dL   Calcium 8.6  8.4 - 10.5 mg/dL   Total Protein 6.2  6.0 - 8.3 g/dL   Albumin 2.7 (*) 3.5 - 5.2 g/dL   AST 16  0 - 37 U/L   ALT 10  0 - 35 U/L   Alkaline Phosphatase PENDING  39 - 117 U/L   Total Bilirubin 0.2 (*) 0.3  - 1.2 mg/dL   GFR calc non Af Amer >90  >90 mL/min   GFR calc Af Amer >90  >90 mL/min   Comment: (NOTE)     The eGFR has been calculated using the CKD EPI equation.     This calculation has not been validated in all clinical situations.     eGFR's persistently <90 mL/min signify possible Chronic Kidney     Disease.  URIC ACID     Status: None   Collection Time    07/15/13  4:28 PM      Result Value Ref Range   Uric Acid, Serum 2.8  2.4 - 7.0 mg/dL   [id number]  MAU Course  Procedures Tylenol 1000 mg po x1 Betamethasone 52m IM x1 dose  Assessment and Plan  29.[redacted] weeks gestation Gestational hypertension-BP stable IDA    Betamethasone prior to discharge, second dose in office tomorrow Discharge home PIH precautions Follow-up in 1 week for ROB Start Niferex daily, continue PNV daily consistently, Increase consumption of Fe rich foods  Assessment and plan discussed with Dr. CGarwin Brothers  Karmen Altamirano, MBruno N 07/15/2013, 5:15 PM

## 2013-08-12 ENCOUNTER — Inpatient Hospital Stay (HOSPITAL_COMMUNITY)
Admission: AD | Admit: 2013-08-12 | Discharge: 2013-08-12 | Disposition: A | Discharge status: Home / Self Care | Payer: BC Managed Care – PPO | Source: Ambulatory Visit | Attending: Obstetrics and Gynecology | Admitting: Obstetrics and Gynecology

## 2013-08-12 ENCOUNTER — Encounter (HOSPITAL_COMMUNITY): Payer: Self-pay | Admitting: *Deleted

## 2013-08-12 DIAGNOSIS — O139 Gestational [pregnancy-induced] hypertension without significant proteinuria, unspecified trimester: Secondary | ICD-10-CM | POA: Insufficient documentation

## 2013-08-12 DIAGNOSIS — O368390 Maternal care for abnormalities of the fetal heart rate or rhythm, unspecified trimester, not applicable or unspecified: Secondary | ICD-10-CM | POA: Insufficient documentation

## 2013-08-12 DIAGNOSIS — R51 Headache: Secondary | ICD-10-CM | POA: Insufficient documentation

## 2013-08-12 DIAGNOSIS — Z3689 Encounter for other specified antenatal screening: Secondary | ICD-10-CM

## 2013-08-12 NOTE — MAU Note (Signed)
Wkly NST due to [facility].  Denies bleeding leaking or pain.  In on BP medicine, - was low in the office.  Has had a headache all day- did not take anything

## 2013-08-12 NOTE — MAU Provider Note (Signed)
  History     CSN: [id number]  Arrival date and time: 08/12/13 1716   None     Chief Complaint  Patient presents with  . nonnreactive fetal tracing.    HPI Comments: [id number] @33 .3 wks sent from office for non-reactive NST. H/o prior CS @32  wks for PEC and 38 wk VBAC. Dx PIH @29  wks, started on Labetalol 100mg  bid (pt reports only taking once daily). Mild HA today, no visual changes, or epigastric pain. Good FM.   OB History   Grav Para Term Preterm Abortions TAB SAB Ect Mult Living   3 2 1 1      2       Past Medical History  Diagnosis Date  . Pregnancy induced hypertension   . Heart murmur     no medication  . Fibroid   . Preterm labor     Past Surgical History  Procedure Laterality Date  . Cesarean section      Family History  Problem Relation Age of Onset  . Hypertension Father   . Cancer Father     History  Substance Use Topics  . Smoking status: Never Smoker   . Smokeless tobacco: Never Used  . Alcohol Use: No    Allergies: No Known Allergies  Prescriptions prior to admission  Medication Sig Dispense Refill  . acetaminophen (TYLENOL) 325 MG tablet Take 325 mg by mouth every 6 (six) hours as needed for headache.      . iron polysaccharides (NIFEREX) 150 MG capsule Take 1 capsule (150 mg total) by mouth daily.  30 capsule  2  . labetalol (NORMODYNE) 100 MG tablet Take 100 mg by mouth daily.        Review of Systems  Constitutional: Negative.   Eyes: Positive for blurred vision. Negative for double vision and photophobia.  Respiratory: Negative.   Cardiovascular: Negative.   Gastrointestinal: Negative.   Genitourinary: Negative.   Musculoskeletal: Negative.   Neurological: Positive for headaches. Negative for dizziness.  Endo/Heme/Allergies: Negative.   Psychiatric/Behavioral: Negative.    Physical Exam   Blood pressure 115/72, pulse 80, temperature 98.1 F (36.7 C), temperature source Oral, resp. rate 16, height 5' (1.524 m), weight 67.132 kg  (148 lb), unknown if currently breastfeeding.  Physical Exam  Constitutional: She is oriented to person, place, and time. She appears well-developed and well-nourished.  HENT:  Head: Normocephalic.  Neck: Normal range of motion. Neck supple.  Cardiovascular: Normal rate and regular rhythm.   Respiratory: Effort normal and breath sounds normal.  GI: Soft. Bowel sounds are normal.  gravid  Genitourinary:  deferred  Musculoskeletal: Normal range of motion.  Neurological: She is alert and oriented to person, place, and time. She has normal reflexes.  No clonus  Skin: Skin is warm and dry.  Psychiatric: She has a normal mood and affect.  FHR: 125 bpm, mod variability, +accels, no decels Toco: rare NST-reactive  MAU Course  Procedures NST MDM n/a  Assessment and Plan  33.[redacted] weeks gestation Reactive NST [place]  Discharge home Keep scheduled appt next week Continue Labetalol PIH precautions and FMCs  Assessment and plan discussed with Dr. Janese Banks 08/12/2013, 7:42 PM

## 2013-08-12 NOTE — Discharge Instructions (Signed)
Hypertension During Pregnancy Hypertension is also called high blood pressure. It can occur at any time in life and during pregnancy. When you have hypertension, there is extra pressure inside your blood vessels that carry blood from the heart to the rest of your body (arteries). Hypertension during pregnancy can cause problems for you and your baby. Your baby might not weigh as much as it should at birth or might be born early (premature). Very bad cases of hypertension during pregnancy can be life threatening.  Different types of hypertension can occur during pregnancy.   Chronic hypertension. This happens when a woman has hypertension before pregnancy and it continues during pregnancy.  Gestational hypertension. This is when hypertension develops during pregnancy.  Preeclampsia or toxemia of pregnancy. This is a very serious type of hypertension that develops only during pregnancy. It is a disease that affects the whole body (systemic) and can be very dangerous for both mother and baby.  Gestational hypertension and preeclampsia usually go away after your baby is born. Blood pressure generally stabilizes within 6 weeks. Women who have hypertension during pregnancy have a greater chance of developing hypertension later in life or with future pregnancies. RISK FACTORS Some factors make you more likely to develop hypertension during pregnancy. Risk factors include:  Having hypertension before pregnancy.  Having hypertension during a previous pregnancy.  Being overweight.  Being older than 40.  Being pregnant with more than one baby (multiples).  Having diabetes or kidney problems. SIGNS AND SYMPTOMS Chronic and gestational hypertension rarely cause symptoms. Preeclampsia has symptoms, which may include:  Increased protein in your urine. Your health care provider will check for this at every prenatal visit.  Swelling of your hands and face.  Rapid weight gain.  Headaches.  Visual  changes.  Being bothered by light.  Abdominal pain, especially in the right upper area.  Chest pain.  Shortness of breath.  Increased reflexes.  Seizures. Seizures occur with a more severe form of preeclampsia, called eclampsia. DIAGNOSIS   You may be diagnosed with hypertension during a regular prenatal exam. At each visit, tests may include:  Blood pressure checks.  A urine test to check for protein in your urine.  The type of hypertension you are diagnosed with depends on when you developed it. It also depends on your specific blood pressure reading.  Developing hypertension before 20 weeks of pregnancy is consistent with chronic hypertension.  Developing hypertension after 20 weeks of pregnancy is consistent with gestational hypertension.  Hypertension with increased urinary protein is diagnosed as preeclampsia.  Blood pressure measurements that stay above 160 systolic or 110 diastolic are a sign of severe preeclampsia. TREATMENT Treatment for hypertension during pregnancy varies. Treatment depends on the type of hypertension and how serious it is.  If you take medicine for chronic hypertension, you may need to switch medicines.  Drugs called ACE inhibitors should not be taken during pregnancy.  Low-dose aspirin may be suggested for women who have risk factors for preeclampsia.  If you have gestational hypertension, you may need to take a blood pressure medicine that is safe during pregnancy. Your health care provider will recommend the appropriate medicine.  If you have severe preeclampsia, you may need to be in the hospital. Health care providers will watch you and the baby very closely. You also may need to take medicine (magnesium sulfate) to prevent seizures and lower blood pressure.  Sometimes an early delivery is needed. This may be the case if the condition worsens. It would   be done to protect you and the baby. The only cure for preeclampsia is delivery. HOME  CARE INSTRUCTIONS  Schedule and keep all of your regular appointments for prenatal care.  Only take over-the-counter or prescription medicines as directed by your health care provider. Tell your health care provider about all medicines you take.  Eat as little salt as possible.  Get regular exercise.  Do not drink alcohol.  Do not use tobacco products.  Do not drink products with caffeine.  Lie on your left side when resting. SEEK IMMEDIATE MEDICAL CARE IF:  You have severe abdominal pain.  You have sudden swelling in the hands, ankles, or face.  You gain 4 pounds (1.8 kg) or more in 1 week.  You vomit repeatedly.  You have vaginal bleeding.  You do not feel the baby moving as much.  You have a headache.  You have blurred or double vision.  You have muscle twitching or spasms.  You have shortness of breath.  You have blue fingernails and lips.  You have blood in your urine. MAKE SURE YOU:  Understand these instructions.  Will watch your condition.  Will get help right away if you are not doing well or get worse. Document Released: 01/02/2011 Document Revised: 02/04/2013 Document Reviewed: 11/13/2012 [employer] Patient Information 2014 [employer].  

## 2013-08-27 LAB — OB RESULTS CONSOLE GBS: GBS: NEGATIVE

## 2013-09-12 ENCOUNTER — Inpatient Hospital Stay (HOSPITAL_COMMUNITY)
Admission: AD | Admit: 2013-09-12 | Discharge: 2013-09-12 | Disposition: A | Discharge status: Home / Self Care | Payer: BC Managed Care – PPO | Source: Ambulatory Visit | Attending: Obstetrics & Gynecology | Admitting: Obstetrics & Gynecology

## 2013-09-12 ENCOUNTER — Encounter (HOSPITAL_COMMUNITY): Payer: Self-pay | Admitting: Family

## 2013-09-12 DIAGNOSIS — O479 False labor, unspecified: Secondary | ICD-10-CM | POA: Insufficient documentation

## 2013-09-12 LAB — POCT FERN TEST: POCT FERN TEST: NEGATIVE

## 2013-09-12 NOTE — MAU Note (Signed)
33 yo, G3P2 at [redacted]w[redacted]d, presents to MAU with c/o contractions since 4am. Reports +FM. Denies VB. Watery, vaginal discharge.  Hx complicated by preeclampsia first pregnancy; taking labetalol 100mg  for current PIH. Took home dose upon arrival.

## 2013-09-16 ENCOUNTER — Encounter (HOSPITAL_COMMUNITY): Payer: Self-pay

## 2013-09-16 ENCOUNTER — Inpatient Hospital Stay (HOSPITAL_COMMUNITY): Payer: BC Managed Care – PPO

## 2013-09-16 ENCOUNTER — Inpatient Hospital Stay (HOSPITAL_COMMUNITY)
Admission: AD | Admit: 2013-09-16 | Discharge: 2013-09-16 | Disposition: A | Discharge status: Home / Self Care | Payer: BC Managed Care – PPO | Source: Ambulatory Visit | Attending: Obstetrics and Gynecology | Admitting: Obstetrics and Gynecology

## 2013-09-16 DIAGNOSIS — O368390 Maternal care for abnormalities of the fetal heart rate or rhythm, unspecified trimester, not applicable or unspecified: Secondary | ICD-10-CM | POA: Insufficient documentation

## 2013-09-16 DIAGNOSIS — O139 Gestational [pregnancy-induced] hypertension without significant proteinuria, unspecified trimester: Secondary | ICD-10-CM | POA: Insufficient documentation

## 2013-09-16 NOTE — MAU Note (Signed)
Pt presents from office. Had an NST that was nonreactive and 6/8 BPP. Comnmuinicated w/ Agricultural consultant. Room being cleaned in MAU and will bring patient back when bed available.

## 2013-09-16 NOTE — Discharge Instructions (Signed)
Third Trimester of Pregnancy °The third trimester is from week 29 through week 42, months 7 through 9. The third trimester is a time when the fetus is growing rapidly. At the end of the ninth month, the fetus is about 20 inches in length and weighs 6 10 pounds.  °BODY CHANGES °Your body goes through many changes during pregnancy. The changes vary from woman to woman.  °· Your weight will continue to increase. You can expect to gain 25 35 pounds (11 16 kg) by the end of the pregnancy. °· You may begin to get stretch marks on your hips, abdomen, and breasts. °· You may urinate more often because the fetus is moving lower into your pelvis and pressing on your bladder. °· You may develop or continue to have heartburn as a result of your pregnancy. °· You may develop constipation because certain hormones are causing the muscles that push waste through your intestines to slow down. °· You may develop hemorrhoids or swollen, bulging veins (varicose veins). °· You may have pelvic pain because of the weight gain and pregnancy hormones relaxing your joints between the bones in your pelvis. Back aches may result from over exertion of the muscles supporting your posture. °· Your breasts will continue to grow and be tender. A yellow discharge may leak from your breasts called colostrum. °· Your belly button may stick out. °· You may feel short of breath because of your expanding uterus. °· You may notice the fetus "dropping," or moving lower in your abdomen. °· You may have a bloody mucus discharge. This usually occurs a few days to a week before labor begins. °· Your cervix becomes thin and soft (effaced) near your due date. °WHAT TO EXPECT AT YOUR PRENATAL EXAMS  °You will have prenatal exams every 2 weeks until week 36. Then, you will have weekly prenatal exams. During a routine prenatal visit: °· You will be weighed to make sure you and the fetus are growing normally. °· Your blood pressure is taken. °· Your abdomen will be  measured to track your baby's growth. °· The fetal heartbeat will be listened to. °· Any test results from the previous visit will be discussed. °· You may have a cervical check near your due date to see if you have effaced. °At around 36 weeks, your caregiver will check your cervix. At the same time, your caregiver will also perform a test on the secretions of the vaginal tissue. This test is to determine if a type of bacteria, Group B streptococcus, is present. Your caregiver will explain this further. °Your caregiver may ask you: °· What your birth plan is. °· How you are feeling. °· If you are feeling the baby move. °· If you have had any abnormal symptoms, such as leaking fluid, bleeding, severe headaches, or abdominal cramping. °· If you have any questions. °Other tests or screenings that may be performed during your third trimester include: °· Blood tests that check for low iron levels (anemia). °· Fetal testing to check the health, activity level, and growth of the fetus. Testing is done if you have certain medical conditions or if there are problems during the pregnancy. °FALSE LABOR °You may feel small, irregular contractions that eventually go away. These are called Braxton Hicks contractions, or false labor. Contractions may last for hours, days, or even weeks before true labor sets in. If contractions come at regular intervals, intensify, or become painful, it is best to be seen by your caregiver.  °  SIGNS OF LABOR  °· Menstrual-like cramps. °· Contractions that are 5 minutes apart or less. °· Contractions that start on the top of the uterus and spread down to the lower abdomen and back. °· A sense of increased pelvic pressure or back pain. °· A watery or bloody mucus discharge that comes from the vagina. °If you have any of these signs before the 37th week of pregnancy, call your caregiver right away. You need to go to the hospital to get checked immediately. °HOME CARE INSTRUCTIONS  °· Avoid all  smoking, herbs, alcohol, and unprescribed drugs. These chemicals affect the formation and growth of the baby. °· Follow your caregiver's instructions regarding medicine use. There are medicines that are either safe or unsafe to take during pregnancy. °· Exercise only as directed by your caregiver. Experiencing uterine cramps is a good sign to stop exercising. °· Continue to eat regular, healthy meals. °· Wear a good support bra for breast tenderness. °· Do not use hot tubs, steam rooms, or saunas. °· Wear your seat belt at all times when driving. °· Avoid raw meat, uncooked cheese, cat litter boxes, and soil used by cats. These carry germs that can cause birth defects in the baby. °· Take your prenatal vitamins. °· Try taking a stool softener (if your caregiver approves) if you develop constipation. Eat more high-fiber foods, such as fresh vegetables or fruit and whole grains. Drink plenty of fluids to keep your urine clear or pale yellow. °· Take warm sitz baths to soothe any pain or discomfort caused by hemorrhoids. Use hemorrhoid cream if your caregiver approves. °· If you develop varicose veins, wear support hose. Elevate your feet for 15 minutes, 3 4 times a day. Limit salt in your diet. °· Avoid heavy lifting, wear low heal shoes, and practice good posture. °· Rest a lot with your legs elevated if you have leg cramps or low back pain. °· Visit your dentist if you have not gone during your pregnancy. Use a soft toothbrush to brush your teeth and be gentle when you floss. °· A sexual relationship may be continued unless your caregiver directs you otherwise. °· Do not travel far distances unless it is absolutely necessary and only with the approval of your caregiver. °· Take prenatal classes to understand, practice, and ask questions about the labor and delivery. °· Make a trial run to the hospital. °· Pack your hospital bag. °· Prepare the baby's nursery. °· Continue to go to all your prenatal visits as directed  by your caregiver. °SEEK MEDICAL CARE IF: °· You are unsure if you are in labor or if your water has broken. °· You have dizziness. °· You have mild pelvic cramps, pelvic pressure, or nagging pain in your abdominal area. °· You have persistent nausea, vomiting, or diarrhea. °· You have a bad smelling vaginal discharge. °· You have pain with urination. °SEEK IMMEDIATE MEDICAL CARE IF:  °· You have a fever. °· You are leaking fluid from your vagina. °· You have spotting or bleeding from your vagina. °· You have severe abdominal cramping or pain. °· You have rapid weight loss or gain. °· You have shortness of breath with chest pain. °· You notice sudden or extreme swelling of your face, hands, ankles, feet, or legs. °· You have not felt your baby move in over an hour. °· You have severe headaches that do not go away with medicine. °· You have vision changes. °Document Released: 04/10/2001 Document Revised: 12/17/2012 Document Reviewed:   You have severe abdominal cramping or pain.   You have rapid weight loss or gain.   You have shortness of breath with chest pain.   You notice sudden or extreme swelling of your face, hands, ankles, feet, or legs.   You have not felt your baby move in over an hour.   You have severe headaches that do not go away with medicine.   You have vision changes.  Document Released: 04/10/2001 Document Revised: 12/17/2012 Document Reviewed: 06/17/2012  [employer] Patient Information 2014 [employer].

## 2013-09-16 NOTE — Progress Notes (Signed)
Paged to notify of FHR tracing

## 2013-09-16 NOTE — Progress Notes (Signed)
Paged to notify of pt BPP results

## 2013-09-16 NOTE — MAU Provider Note (Signed)
History     Chief Complaint  Patient presents with  . Non-stress Test   33 yo G3P2002 married female @ 38 4/7 weeks sent from office for further fetal testing  due to NR NST, BPP 6/8. Pt is followed due to [place] on labetalol  OB History   Grav Para Term Preterm Abortions TAB SAB Ect Mult Living   3 2 1 1      2       Past Medical History  Diagnosis Date  . Pregnancy induced hypertension   . Heart murmur     no medication  . Fibroid   . Preterm labor    OB. SVD Past Surgical History  Procedure Laterality Date  . Cesarean section      Family History  Problem Relation Age of Onset  . Hypertension Father   . Cancer Father     History  Substance Use Topics  . Smoking status: Never Smoker   . Smokeless tobacco: Never Used  . Alcohol Use: No    Allergies: No Known Allergies  Prescriptions prior to admission  Medication Sig Dispense Refill  . labetalol (NORMODYNE) 100 MG tablet Take 100 mg by mouth daily.      . Ranitidine HCl (ZANTAC PO) Take 1 tablet by mouth daily.         Physical Exam   Blood pressure 120/89, pulse 82, temperature 98 F (36.7 C), temperature source Oral, resp. rate 18, unknown if currently breastfeeding.  BP 122/82  Pulse 73  Temp(Src) 98 F (36.7 C) (Oral)  Resp 18  Cor RRR Abd gravid nontender Pelvic 1/40/-3 Extrem no edema or calf tenderness  Tracing; reactive. Baseline 120-125 (+) accels up to 150 Sono: BPP 8/8 nl fluid ED Course  PIH on labetalol IUP @ 38 4/7 weeks with reassuring fetal testing  P) d/c home. scheduled for IOL 5/23( 39 weeks) with pitocin and intracervical balloon. Labor prec. Preeclampsia warning signs. Cont daily kick count  MDM   Marvene Staff, MD 3:03 PM 09/16/2013

## 2013-09-16 NOTE — Progress Notes (Signed)
Notified of reactive NST. Received orders for BPP

## 2013-09-17 ENCOUNTER — Telehealth (HOSPITAL_COMMUNITY): Payer: Self-pay | Admitting: *Deleted

## 2013-09-17 ENCOUNTER — Encounter (HOSPITAL_COMMUNITY): Payer: Self-pay | Admitting: *Deleted

## 2013-09-17 NOTE — Telephone Encounter (Signed)
Preadmission screen  

## 2013-09-20 ENCOUNTER — Inpatient Hospital Stay (HOSPITAL_COMMUNITY)
Admission: AD | Admit: 2013-09-20 | Discharge: 2013-09-22 | DRG: 775 | Disposition: A | Discharge status: Home / Self Care | Payer: BC Managed Care – PPO | Source: Ambulatory Visit | Attending: Obstetrics and Gynecology | Admitting: Obstetrics and Gynecology

## 2013-09-20 DIAGNOSIS — O9903 Anemia complicating the puerperium: Secondary | ICD-10-CM | POA: Diagnosis present

## 2013-09-20 DIAGNOSIS — Z8249 Family history of ischemic heart disease and other diseases of the circulatory system: Secondary | ICD-10-CM

## 2013-09-20 DIAGNOSIS — Z3490 Encounter for supervision of normal pregnancy, unspecified, unspecified trimester: Secondary | ICD-10-CM

## 2013-09-20 DIAGNOSIS — O139 Gestational [pregnancy-induced] hypertension without significant proteinuria, unspecified trimester: Principal | ICD-10-CM | POA: Diagnosis present

## 2013-09-20 DIAGNOSIS — D509 Iron deficiency anemia, unspecified: Secondary | ICD-10-CM | POA: Diagnosis present

## 2013-09-20 DIAGNOSIS — O365990 Maternal care for other known or suspected poor fetal growth, unspecified trimester, not applicable or unspecified: Secondary | ICD-10-CM | POA: Diagnosis present

## 2013-09-20 DIAGNOSIS — O360990 Maternal care for other rhesus isoimmunization, unspecified trimester, not applicable or unspecified: Secondary | ICD-10-CM | POA: Diagnosis present

## 2013-09-20 DIAGNOSIS — O34219 Maternal care for unspecified type scar from previous cesarean delivery: Secondary | ICD-10-CM

## 2013-09-20 LAB — CBC
HEMATOCRIT: 31 % — AB (ref 36.0–46.0)
HEMOGLOBIN: 10.1 g/dL — AB (ref 12.0–15.0)
MCH: 24.2 pg — ABNORMAL LOW (ref 26.0–34.0)
MCHC: 32.6 g/dL (ref 30.0–36.0)
MCV: 74.2 fL — ABNORMAL LOW (ref 78.0–100.0)
Platelets: 246 10*3/uL (ref 150–400)
RBC: 4.18 MIL/uL (ref 3.87–5.11)
RDW: 14.3 % (ref 11.5–15.5)
WBC: 11.8 10*3/uL — ABNORMAL HIGH (ref 4.0–10.5)

## 2013-09-20 LAB — TYPE AND SCREEN
ABO/RH(D): B NEG
ANTIBODY SCREEN: NEGATIVE

## 2013-09-20 LAB — RPR

## 2013-09-20 MED ORDER — PHENYLEPHRINE 40 MCG/ML (10ML) SYRINGE FOR IV PUSH (FOR BLOOD PRESSURE SUPPORT)
80.0000 ug | PREFILLED_SYRINGE | INTRAVENOUS | Status: DC | PRN
Start: 2013-09-20 — End: 2013-09-21
  Filled 2013-09-20: qty 2
  Filled 2013-09-20: qty 10

## 2013-09-20 MED ORDER — DIPHENHYDRAMINE HCL 50 MG/ML IJ SOLN: 12.5000 mg | INTRAMUSCULAR | Status: DC | PRN

## 2013-09-20 MED ORDER — EPHEDRINE 5 MG/ML INJ
10.0000 mg | INTRAVENOUS | Status: DC | PRN
  Filled 2013-09-20: qty 2

## 2013-09-20 MED ORDER — LACTATED RINGERS IV SOLN: 500.0000 mL | INTRAVENOUS | Status: DC | PRN

## 2013-09-20 MED ORDER — EPHEDRINE 5 MG/ML INJ
10.0000 mg | INTRAVENOUS | Status: DC | PRN
  Filled 2013-09-20: qty 2
  Filled 2013-09-20: qty 4

## 2013-09-20 MED ORDER — LACTATED RINGERS IV SOLN
INTRAVENOUS | Status: DC
Start: 2013-09-20 — End: 2013-09-21
  Administered 2013-09-20: 125 mL/h via INTRAVENOUS

## 2013-09-20 MED ORDER — OXYTOCIN 40 UNITS IN LACTATED RINGERS INFUSION - SIMPLE MED
62.5000 mL/h | INTRAVENOUS | Status: DC
  Filled 2013-09-20: qty 1000

## 2013-09-20 MED ORDER — ACETAMINOPHEN 325 MG PO TABS: 650.0000 mg | ORAL_TABLET | ORAL | Status: DC | PRN

## 2013-09-20 MED ORDER — IBUPROFEN 600 MG PO TABS
600.0000 mg | ORAL_TABLET | Freq: Four times a day (QID) | ORAL | Status: DC | PRN
  Administered 2013-09-21: 600 mg via ORAL
  Filled 2013-09-20: qty 1

## 2013-09-20 MED ORDER — CITRIC ACID-SODIUM CITRATE 334-500 MG/5ML PO SOLN: 30.0000 mL | ORAL | Status: DC | PRN

## 2013-09-20 MED ORDER — ONDANSETRON HCL 4 MG/2ML IJ SOLN
4.0000 mg | Freq: Four times a day (QID) | INTRAMUSCULAR | Status: DC | PRN
  Administered 2013-09-21: 4 mg via INTRAVENOUS
  Filled 2013-09-20: qty 2

## 2013-09-20 MED ORDER — LACTATED RINGERS IV SOLN
500.0000 mL | Freq: Once | INTRAVENOUS | Status: AC
  Administered 2013-09-20: 500 mL via INTRAVENOUS

## 2013-09-20 MED ORDER — FENTANYL 2.5 MCG/ML BUPIVACAINE 1/10 % EPIDURAL INFUSION (WH - ANES)
14.0000 mL/h | INTRAMUSCULAR | Status: DC | PRN
  Filled 2013-09-20: qty 125

## 2013-09-20 MED ORDER — OXYTOCIN 40 UNITS IN LACTATED RINGERS INFUSION - SIMPLE MED
1.0000 m[IU]/min | INTRAVENOUS | Status: DC
  Administered 2013-09-20: 2 m[IU]/min via INTRAVENOUS

## 2013-09-20 MED ORDER — LIDOCAINE HCL (PF) 1 % IJ SOLN
30.0000 mL | INTRAMUSCULAR | Status: DC | PRN
  Filled 2013-09-20: qty 30

## 2013-09-20 MED ORDER — OXYTOCIN BOLUS FROM INFUSION: 500.0000 mL | INTRAVENOUS | Status: DC

## 2013-09-20 MED ORDER — OXYCODONE-ACETAMINOPHEN 5-325 MG PO TABS: 1.0000 | ORAL_TABLET | ORAL | Status: DC | PRN

## 2013-09-20 MED ORDER — PHENYLEPHRINE 40 MCG/ML (10ML) SYRINGE FOR IV PUSH (FOR BLOOD PRESSURE SUPPORT)
80.0000 ug | PREFILLED_SYRINGE | INTRAVENOUS | Status: DC | PRN
  Filled 2013-09-20: qty 2

## 2013-09-20 MED ORDER — BUTORPHANOL TARTRATE 1 MG/ML IJ SOLN
1.0000 mg | INTRAMUSCULAR | Status: DC | PRN
  Administered 2013-09-20: 1 mg via INTRAVENOUS
  Filled 2013-09-20: qty 1

## 2013-09-20 MED ORDER — TERBUTALINE SULFATE 1 MG/ML IJ SOLN: 0.2500 mg | Freq: Once | INTRAMUSCULAR | Status: AC | PRN

## 2013-09-20 NOTE — Progress Notes (Signed)
Monitoring changed from external u/s and toco to beacon

## 2013-09-20 NOTE — Progress Notes (Signed)
External u/s and cardio replaced

## 2013-09-20 NOTE — H&P (Signed)
Carolyn Galloway is a 33 y.o. female presenting for IOL @ 39 weeks due to [facility]. Pt has had nl PIH labs. Pt has been on labetalol 100mg  po qd. Previous C/S with successful VBAC in past. Pt desires VBAC again (+)FM denies vaginal bleeding, preeclampsia warning signs  Maternal Medical History:  Contractions: Frequency: irregular.    Fetal activity: Perceived fetal activity is normal.    Prenatal complications: PIH.     OB History   Grav Para Term Preterm Abortions TAB SAB Ect Mult Living   3 2 1 1      2      Past Medical History  Diagnosis Date  . Pregnancy induced hypertension   . Heart murmur     no medication  . Fibroid   . Preterm labor    Past Surgical History  Procedure Laterality Date  . Cesarean section     Family History: family history includes Cancer in her father; Hypertension in her father. Social History:  reports that she has never smoked. She has never used smokeless tobacco. She reports that she does not drink alcohol or use illicit drugs.   Prenatal Transfer Tool  Maternal Diabetes: No Genetic Screening: Declined Maternal Ultrasounds/Referrals: Normal Fetal Ultrasounds or other Referrals:  None Maternal Substance Abuse:  No Significant Maternal Medications:  Meds include: Other:  Significant Maternal Lab Results:  Lab values include: Group B Strep negative, Rh negative Other Comments:  PIH on labetalol. SGA  Review of Systems  All other systems reviewed and are negative.   Dilation: 1 Effacement (%): 50 Station: -3 Exam by:: [email or web address] Blood pressure 121/89, pulse 76, temperature 97.9 F (36.6 C), temperature source Oral, resp. rate 18, height 5' (1.524 m), weight 67.132 kg (148 lb), unknown if currently breastfeeding. Exam Physical Exam  Constitutional: She is oriented to person, place, and time. She appears well-developed and well-nourished.  HENT:  Head: Atraumatic.  Eyes: EOM are normal.  Neck: Neck supple.  Cardiovascular: Regular rhythm.    Respiratory: Breath sounds normal.  GI: Soft.  Musculoskeletal: She exhibits no edema.  Neurological: She is alert and oriented to person, place, and time.  Skin: Skin is warm and dry.   Tracing: baseline 120 (+)accels to 140-145 Ctx irreg Prenatal labs: ABO, Rh: --/--/B NEG (05/24 0825) Antibody: NEG (05/24 0825) Rubella: Immune (11/04 0000) RPR: Nonreactive (11/04 0000)  HBsAg: Negative (11/04 0000)  HIV: Non-reactive (11/04 0000)  GBS: Negative (04/30 0000)   CBC    Component Value Date/Time   WBC 11.8* 09/20/2013 0825   RBC 4.18 09/20/2013 0825   HGB 10.1* 09/20/2013 0825   HCT 31.0* 09/20/2013 0825   PLT 246 09/20/2013 0825   MCV 74.2* 09/20/2013 0825   MCH 24.2* 09/20/2013 0825   MCHC 32.6 09/20/2013 0825   RDW 14.3 09/20/2013 0825   LYMPHSABS 2.3 07/29/2010 0525   MONOABS 0.6 07/29/2010 0525   EOSABS 0.3 07/29/2010 0525   BASOSABS 0.1 07/29/2010 0525     Assessment/Plan: PIH Term gestation Previous C/S with successful VBAC. P) admit routine labs. Hold labetalol. Pitocin. Intracervical balloon for ripening. Amniotomy prn   Woodrow Dulski A Carolyn Galloway 09/20/2013, 12:12 PM

## 2013-09-20 NOTE — Progress Notes (Signed)
Beacon removed.  Foley bulb out.  Vag exam done. RN unable to reach cervix

## 2013-09-20 NOTE — Progress Notes (Signed)
IOL 2nd to [facility]  S: visual spots resolved. No headache. Notes painful ctx. Declines epidural VS: Pitocin 6 miu BP 134/98 Balloon out VE 4/50/-3 deviated to left vtx intact  Tracing: baseline 120-125 (+) accels to 145-150 Ctx( quads) spaced 1-4 mins  IMP: latent phase PIH Previous C/S with prior vbac success Term P) cont to gentle increase pitocin( 1 miu) to break dysfunctional pattern Exaggerated right sims position using peanut ball Defer amniotomy

## 2013-09-20 NOTE — Progress Notes (Signed)
Abdomen soft to palpation.  Beacon remote redocked and fhr started to trace again

## 2013-09-20 NOTE — Progress Notes (Signed)
S; frustrated due to slow progress. Ctx are no longer painful per pt  O: Pitocin 14 miu BP 131/94 VE: loose 4 cm/70%/-3 deviated to left intact membrane  tracing cat 1. Baseline 120 Ctx q 2-71mins   A/P latent phase PIH Term gestation Previous C/S  P) cont with pitocin. Exaggerated right sims w/ peanut ball.

## 2013-09-21 ENCOUNTER — Encounter (HOSPITAL_COMMUNITY): Payer: BC Managed Care – PPO | Admitting: Anesthesiology

## 2013-09-21 ENCOUNTER — Encounter (HOSPITAL_COMMUNITY): Payer: Self-pay

## 2013-09-21 ENCOUNTER — Inpatient Hospital Stay (HOSPITAL_COMMUNITY): Payer: BC Managed Care – PPO | Admitting: Anesthesiology

## 2013-09-21 DIAGNOSIS — O34219 Maternal care for unspecified type scar from previous cesarean delivery: Secondary | ICD-10-CM

## 2013-09-21 LAB — CBC
HCT: 30.6 % — ABNORMAL LOW (ref 36.0–46.0)
HCT: 30.9 % — ABNORMAL LOW (ref 36.0–46.0)
Hemoglobin: 10 g/dL — ABNORMAL LOW (ref 12.0–15.0)
Hemoglobin: 10.3 g/dL — ABNORMAL LOW (ref 12.0–15.0)
MCH: 24.4 pg — ABNORMAL LOW (ref 26.0–34.0)
MCH: 24.6 pg — ABNORMAL LOW (ref 26.0–34.0)
MCHC: 32.7 g/dL (ref 30.0–36.0)
MCHC: 33.3 g/dL (ref 30.0–36.0)
MCV: 73.9 fL — ABNORMAL LOW (ref 78.0–100.0)
MCV: 74.8 fL — ABNORMAL LOW (ref 78.0–100.0)
PLATELETS: 192 10*3/uL (ref 150–400)
Platelets: 208 10*3/uL (ref 150–400)
RBC: 4.09 MIL/uL (ref 3.87–5.11)
RBC: 4.18 MIL/uL (ref 3.87–5.11)
RDW: 14.3 % (ref 11.5–15.5)
RDW: 14.4 % (ref 11.5–15.5)
WBC: 13.7 10*3/uL — AB (ref 4.0–10.5)
WBC: 14.1 10*3/uL — AB (ref 4.0–10.5)

## 2013-09-21 MED ORDER — LANOLIN HYDROUS EX OINT: TOPICAL_OINTMENT | CUTANEOUS | Status: DC | PRN

## 2013-09-21 MED ORDER — OXYCODONE-ACETAMINOPHEN 5-325 MG PO TABS: 1.0000 | ORAL_TABLET | ORAL | Status: DC | PRN

## 2013-09-21 MED ORDER — WITCH HAZEL-GLYCERIN EX PADS: 1.0000 "application " | MEDICATED_PAD | CUTANEOUS | Status: DC | PRN

## 2013-09-21 MED ORDER — DIPHENHYDRAMINE HCL 25 MG PO CAPS: 25.0000 mg | ORAL_CAPSULE | Freq: Four times a day (QID) | ORAL | Status: DC | PRN

## 2013-09-21 MED ORDER — FENTANYL 2.5 MCG/ML BUPIVACAINE 1/10 % EPIDURAL INFUSION (WH - ANES): 14.0000 mL/h | INTRAMUSCULAR | Status: DC | PRN

## 2013-09-21 MED ORDER — PRENATAL MULTIVITAMIN CH
1.0000 | ORAL_TABLET | Freq: Every day | ORAL | Status: DC
  Administered 2013-09-21 – 2013-09-22 (×2): 1 via ORAL
  Filled 2013-09-21 (×2): qty 1

## 2013-09-21 MED ORDER — ONDANSETRON HCL 4 MG/2ML IJ SOLN: 4.0000 mg | INTRAMUSCULAR | Status: DC | PRN

## 2013-09-21 MED ORDER — SENNOSIDES-DOCUSATE SODIUM 8.6-50 MG PO TABS
2.0000 | ORAL_TABLET | ORAL | Status: DC
  Administered 2013-09-21: 2 via ORAL
  Filled 2013-09-21: qty 2

## 2013-09-21 MED ORDER — DIBUCAINE 1 % RE OINT: 1.0000 "application " | TOPICAL_OINTMENT | RECTAL | Status: DC | PRN

## 2013-09-21 MED ORDER — ZOLPIDEM TARTRATE 5 MG PO TABS: 5.0000 mg | ORAL_TABLET | Freq: Every evening | ORAL | Status: DC | PRN

## 2013-09-21 MED ORDER — IBUPROFEN 600 MG PO TABS
600.0000 mg | ORAL_TABLET | Freq: Four times a day (QID) | ORAL | Status: DC
  Administered 2013-09-21 – 2013-09-22 (×5): 600 mg via ORAL
  Filled 2013-09-21 (×5): qty 1

## 2013-09-21 MED ORDER — FERROUS SULFATE 325 (65 FE) MG PO TABS
325.0000 mg | ORAL_TABLET | Freq: Two times a day (BID) | ORAL | Status: DC
  Administered 2013-09-21 – 2013-09-22 (×3): 325 mg via ORAL
  Filled 2013-09-21 (×3): qty 1

## 2013-09-21 MED ORDER — BENZOCAINE-MENTHOL 20-0.5 % EX AERO
1.0000 "application " | INHALATION_SPRAY | CUTANEOUS | Status: DC | PRN
  Administered 2013-09-21: 1 via TOPICAL
  Filled 2013-09-21: qty 56

## 2013-09-21 MED ORDER — SIMETHICONE 80 MG PO CHEW: 80.0000 mg | CHEWABLE_TABLET | ORAL | Status: DC | PRN

## 2013-09-21 MED ORDER — ONDANSETRON HCL 4 MG PO TABS: 4.0000 mg | ORAL_TABLET | ORAL | Status: DC | PRN

## 2013-09-21 MED ORDER — RHO D IMMUNE GLOBULIN 1500 UNIT/2ML IJ SOSY
300.0000 ug | PREFILLED_SYRINGE | Freq: Once | INTRAMUSCULAR | Status: AC
  Administered 2013-09-21: 300 ug via INTRAVENOUS
  Filled 2013-09-21: qty 2

## 2013-09-21 NOTE — Anesthesia Postprocedure Evaluation (Signed)
Anesthesia Post Note  Patient: Carolyn Galloway  Procedure(s) Performed: * No procedures listed *  Anesthesia type: Epidural  Patient location: Mother/Baby  Post pain: Pain level controlled  Post assessment: Post-op Vital signs reviewed  Last Vitals:  Filed Vitals:   09/21/13 0750  BP: 109/72  Pulse: 79  Temp: 36.8 C  Resp: 20    Post vital signs: Reviewed  Level of consciousness:alert  Complications: No apparent anesthesia complications

## 2013-09-21 NOTE — Lactation Note (Signed)
This note was copied from the chart of Carolyn Galloway. Lactation Consultation Note  Patient Name: Carolyn Galloway [id number] Date: 09/21/2013 Reason for consult: Initial assessment, when LC walked in room mom mentioned baby had been feeding for a few minutes. On and off. LC assisted changing position,football, left breast, on and off pattern at 1st and then [facility] assisted with depth and  baby fed 10 mins with swallows, increased with breast compressions. Due the steady flow of colostrum , and baby's ability  To latch , will reassess for pumping tomorrow. Per mom did not want to do extra pumping at this time . Mother informed of  post-discharge support and given phone number to the lactation department, including services for phone call assistance;  out-patient appointments; and breastfeeding support group. List of other breastfeeding resources in the community given in  the handout. Encouraged mother to call for problems or concerns related to breastfeeding.   Maternal Data Formula Feeding for Exclusion: No Has patient been taught Hand Expression?: Yes (steady flow of colostrum noted both breast ) Does the patient have breastfeeding experience prior to this delivery?: Yes  Feeding Feeding Type: Breast Fed (switched to left breast , football ) Length of feed: 10 min  LATCH Score/Interventions Latch: Repeated attempts needed to sustain latch, nipple held in mouth throughout feeding, stimulation needed to elicit sucking reflex. (LC assisted with depth and positioning ) Intervention(s): Adjust position;Assist with latch;Breast massage;Breast compression  Audible Swallowing: Spontaneous and intermittent Intervention(s): Alternate breast massage  Type of Nipple: Everted at rest and after stimulation  Comfort (Breast/Nipple): Soft / non-tender     Hold (Positioning): Assistance needed to correctly position infant at breast and maintain latch. Intervention(s): Breastfeeding basics  reviewed;Support Pillows;Position options;Skin to skin  LATCH Score: 8  Lactation Tools Discussed/Used     Consult Status Consult Status: Follow-up Date: 09/21/13 Follow-up type: In-patient    [place] 09/21/2013, 3:39 PM

## 2013-09-21 NOTE — Anesthesia Preprocedure Evaluation (Signed)
Anesthesia Evaluation  Patient identified by MRN, date of birth, ID band Patient awake    Reviewed: Allergy & Precautions, H&P , Patient's Chart, lab work & pertinent test results  Airway Mallampati: II TM Distance: >3 FB Neck ROM: full    Dental  (+) Teeth Intact   Pulmonary  breath sounds clear to auscultation        Cardiovascular hypertension, On Medications Rhythm:regular Rate:Normal     Neuro/Psych    GI/Hepatic GERD-  Medicated,  Endo/Other    Renal/GU      Musculoskeletal   Abdominal   Peds  Hematology  (+) anemia ,   Anesthesia Other Findings       Reproductive/Obstetrics (+) Pregnancy                           Anesthesia Physical Anesthesia Plan  ASA: II  Anesthesia Plan: Epidural   Post-op Pain Management:    Induction:   Airway Management Planned:   Additional Equipment:   Intra-op Plan:   Post-operative Plan:   Informed Consent: I have reviewed the patients History and Physical, chart, labs and discussed the procedure including the risks, benefits and alternatives for the proposed anesthesia with the patient or authorized representative who has indicated his/her understanding and acceptance.   Dental Advisory Given  Plan Discussed with:   Anesthesia Plan Comments: (Labs checked- platelets confirmed with RN in room. Fetal heart tracing, per RN, reported to be stable enough for sitting procedure. Discussed epidural, and patient consents to the procedure:  included risk of possible headache,backache, failed block, allergic reaction, and nerve injury. This patient was asked if she had any questions or concerns before the procedure started.)        Anesthesia Quick Evaluation

## 2013-09-22 LAB — RH IG WORKUP (INCLUDES ABO/RH)
ABO/RH(D): B NEG
FETAL SCREEN: NEGATIVE
GESTATIONAL AGE(WKS): 39.1
Unit division: 0

## 2013-09-22 MED ORDER — OXYCODONE-ACETAMINOPHEN 5-325 MG PO TABS: 1.0000 | ORAL_TABLET | ORAL | Status: AC | PRN

## 2013-09-22 MED ORDER — IRON POLYSACCH CMPLX-B12-FA 150-0.025-1 MG PO CAPS: 1.0000 | ORAL_CAPSULE | Freq: Every day | ORAL | Status: AC

## 2013-09-22 MED ORDER — PRENATAL MULTIVITAMIN CH: 1.0000 | ORAL_TABLET | Freq: Every day | ORAL | Status: AC

## 2013-09-22 MED ORDER — IBUPROFEN 600 MG PO TABS: 600.0000 mg | ORAL_TABLET | Freq: Four times a day (QID) | ORAL | Status: AC

## 2013-09-22 NOTE — Lactation Note (Signed)
This note was copied from the chart of Carolyn Raye Sorrow. Lactation Consultation Note Assisted with feeding in cross cradle and football hold.  Mom easily expresses transitional milk.  Baby latches easily and demonstrated how to obtain deeper latch.  Reviewed waking techniques and breast massage and compression. Reviewed basics and discharge teaching.  Questions answered. Encouraged to call for outpatient services prn. Patient Name: Carolyn Galloway [id number] Date: 09/22/2013 Reason for consult: Follow-up assessment;Infant < 6lbs   Maternal Data    Feeding Feeding Type: Breast Fed Length of feed: 30 min  LATCH Score/Interventions Latch: Grasps breast easily, tongue down, lips flanged, rhythmical sucking. Intervention(s): Adjust position;Assist with latch;Breast massage;Breast compression  Audible Swallowing: A few with stimulation Intervention(s): Hand expression;Alternate breast massage  Type of Nipple: Everted at rest and after stimulation  Comfort (Breast/Nipple): Soft / non-tender     Hold (Positioning): Assistance needed to correctly position infant at breast and maintain latch. Intervention(s): Breastfeeding basics reviewed;Support Pillows;Position options;Skin to skin  LATCH Score: 8  Lactation Tools Discussed/Used     Consult Status Consult Status: Complete    Franki Monte 09/22/2013, 10:55 AM

## 2013-09-22 NOTE — Discharge Summary (Signed)
Obstetric Discharge Summary  Reason for Admission: induction of labor  - PIH, previous C/S Prenatal Procedures:  ultrasound Intrapartum Procedures: spontaneous vaginal delivery and repeat VBAC after successful TOLAC x2, intracervical ballooning Postpartum Procedures: none Complications-Operative and Postpartum: 1st degree perineal laceration Hemoglobin  Date Value Ref Range Status  09/21/2013 10.0* 12.0 - 15.0 g/dL Final     HCT  Date Value Ref Range Status  09/21/2013 30.6* 36.0 - 46.0 % Final    Physical Exam:  General: alert, cooperative and no distress Lochia: appropriate Uterine Fundus: firm Incision: healing well DVT Evaluation: No evidence of DVT seen on physical exam.  Discharge Diagnoses: Term Pregnancy-delivered - repeat VBAC - IDA of pregnancy - stable - PIH stable & resolving postpartum 2) IDA  Discharge Information: Date: 09/22/2013 Activity: pelvic rest Diet: routine Medications: PNV, Ibuprofen, Iron and Percocet Condition: stable Instructions: refer to practice specific booklet Discharge to: home  Follow-up Information   Follow up with Temperance Kelemen A, MD. Schedule an appointment as soon as possible for a visit in 1 week. (Blood pressure check  then 6 weeks for postpartum exam)    Specialty:  Obstetrics and Gynecology   Contact information:   [address] [place] [address] [phone]       Newborn Data: Live born female  Birth Weight: 5 lb 9.6 oz (2540 g) APGAR: 8, 9  Home with mother.  Artelia Laroche 09/22/2013, 9:03 AM

## 2013-09-22 NOTE — Discharge Instructions (Signed)
Stop labetalol - call if any concerns for elevated blood pressure Take prenatal vitamin daily x 1 year - take iron capsule daily x 1 month

## 2013-09-22 NOTE — Progress Notes (Signed)
PPD 1 SVD - repeat vbac  S:  Reports feeling well- desires home today             Tolerating po/ No nausea or vomiting             Bleeding is light             Pain controlled with motrin and percocet             Up ad lib / ambulatory / voiding QS  Newborn breast feeding  O:               VS: BP 126/77  Pulse 71  Temp(Src) 97.8 F (36.6 C) (Oral)  Resp 18  Ht 5' (1.524 m)  Wt 67.132 kg (148 lb)  BMI 28.90 kg/m2  SpO2 97%  Breastfeeding? Unknown   LABS:              Recent Labs  09/20/13 2350 09/21/13 0500  WBC 13.7* 14.1*  HGB 10.3* 10.0*  PLT 208 192               Blood type: --/--/B NEG (05/25 0500)  Rubella: Immune (11/04 0000)                               Physical Exam:             Alert and oriented X3  Lungs: Clear and unlabored  Heart: regular rate and rhythm / no mumurs  Abdomen: soft, non-tender, non-distended              Fundus: firm, non-tender, U-1  Perineum: no edema  Lochia: light  Extremities: no edema, no calf pain or tenderness    A: PPD # 1 repeat VBAC              IDA of pregnancy - stable             Mild PIH without evidence of PEC - stable BP range  Doing well - stable status  P: Routine post partum orders  Dc home  [place], MSN, [facility] 09/22/2013, 8:57 AM

## 2013-10-17 IMAGING — US US OB COMP +14 WK
1 of 2 series · 12 of 28 positions shown · non-contrast
Comparison: none

[Series 1: us ob comp +14 wk · 12 of 84 slices shown]
[im 4/84]
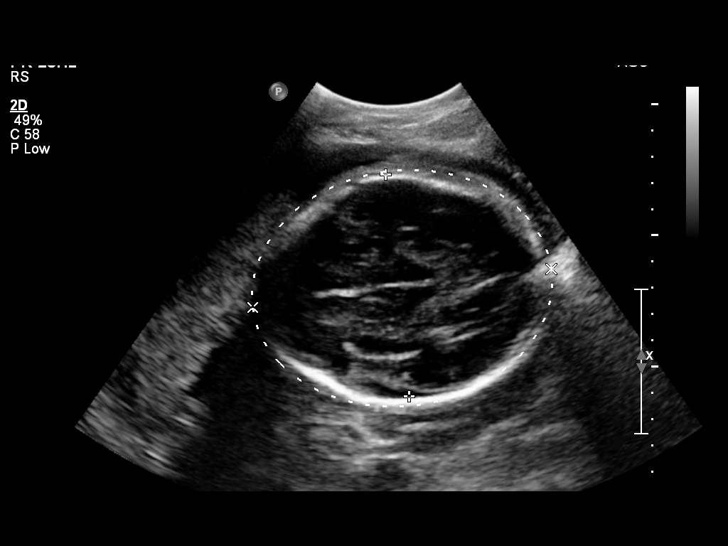
[im 10/84]
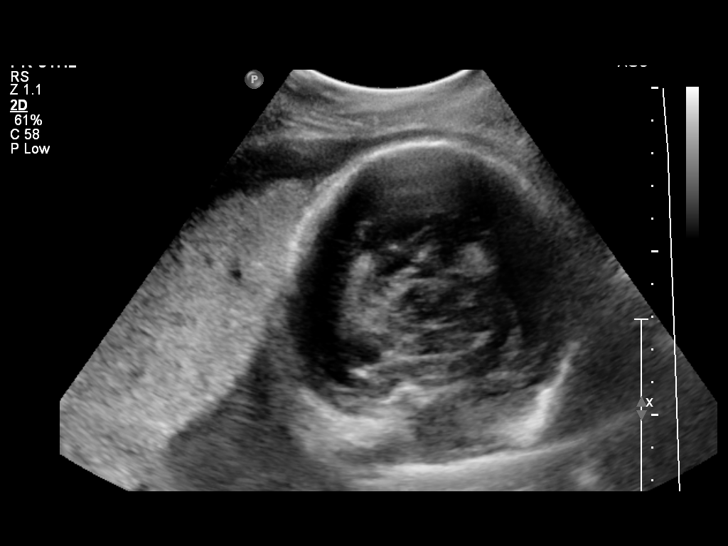
[im 16/84]
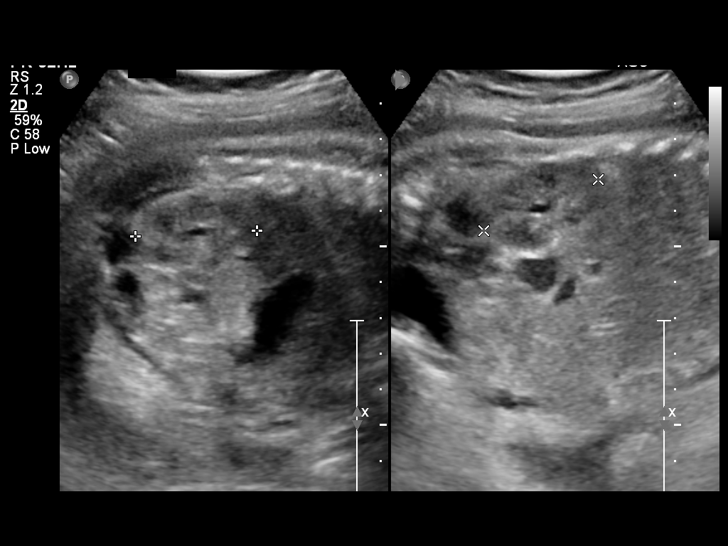
[im 26/84]
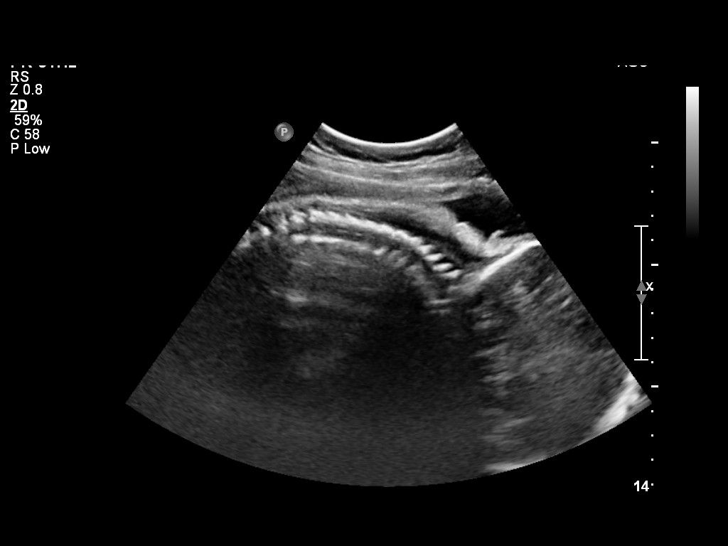
[im 32/84]
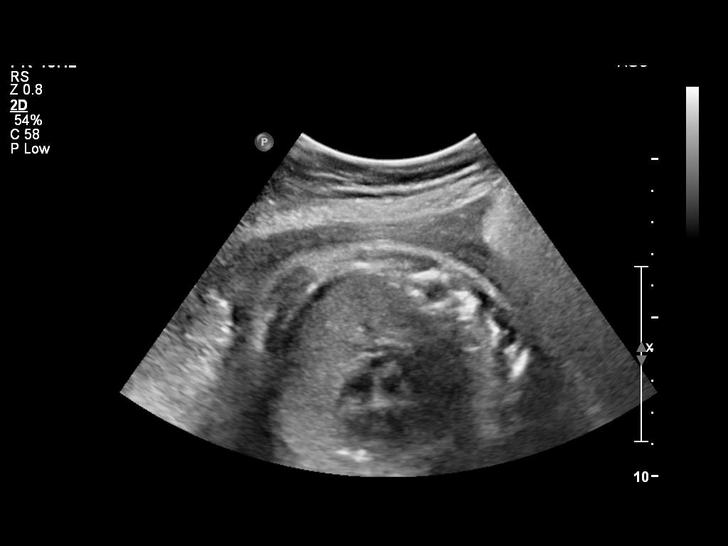
[im 39/84]
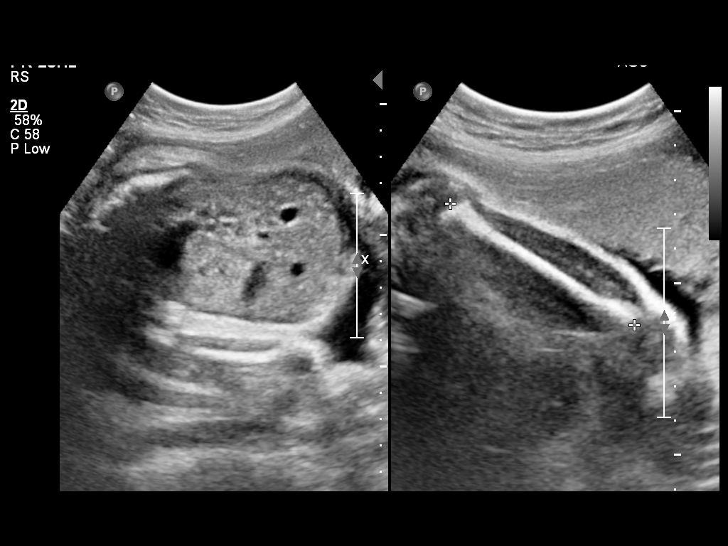
[im 48/84]
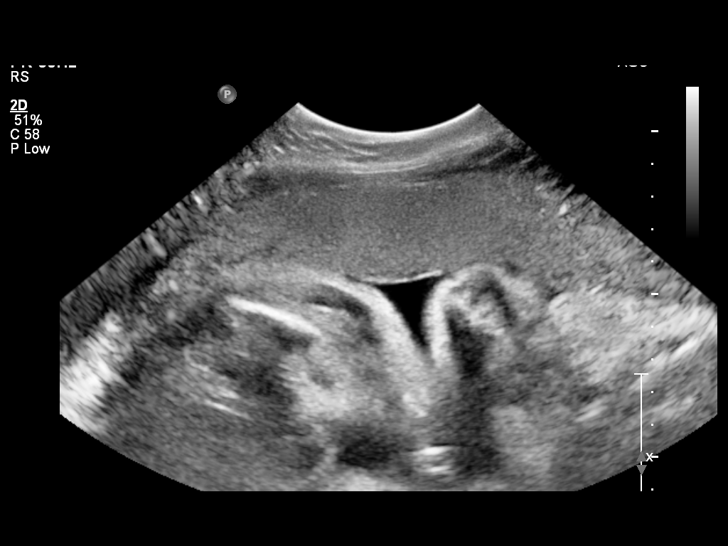
[im 55/84]
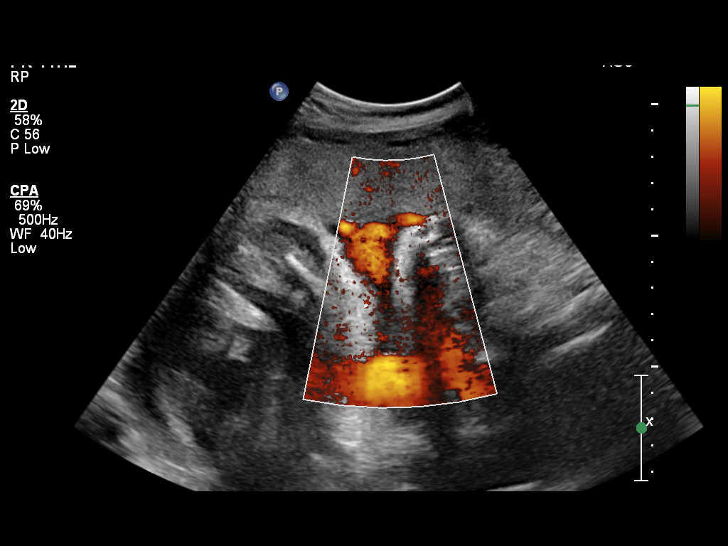
[im 61/84]
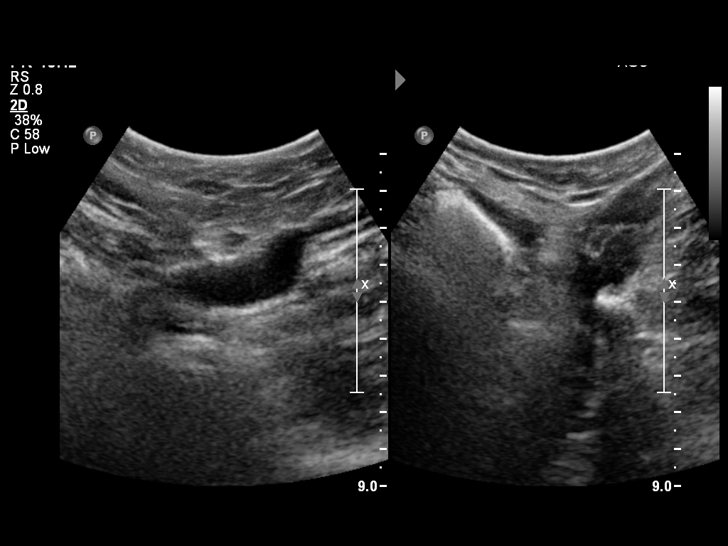
[im 71/84]
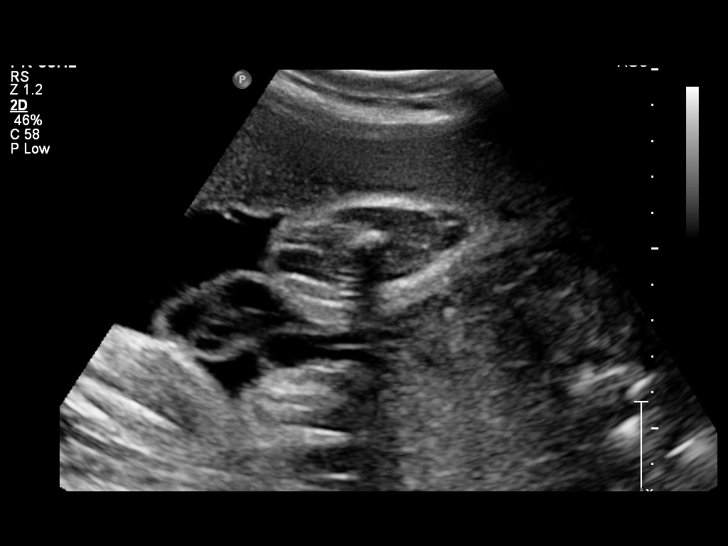
[im 77/84]
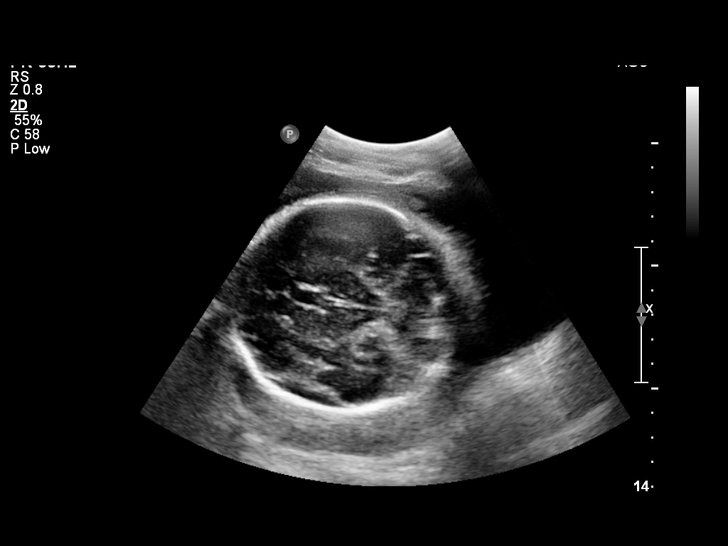
[im 84/84]
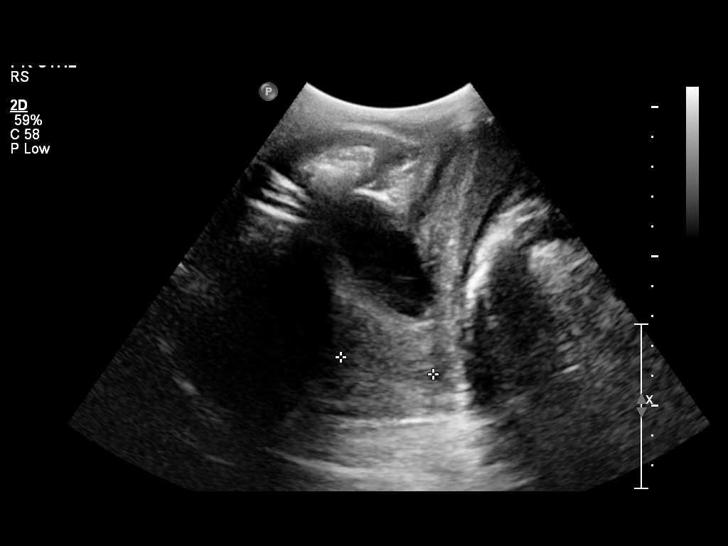

[12 of 28 positions shown; findings below may reference images not displayed]

OBSTETRICS REPORT
                      (Signed Final 04/14/2012 [DATE])

Service(s) Provided

 US OB COMP + 14 WK                                    76805.1
Indications

 Hypertension - Gestational
 Previous cesarean section
Fetal Evaluation

 Num Of Fetuses:    1
 Fetal Heart Rate:  135                         bpm
 Cardiac Activity:  Observed
 Presentation:      Cephalic
 Placenta:          Anterior, above cervical os
 P. Cord            Visualized, central
 Insertion:

 Amniotic Fluid
 AFI FV:      Subjectively within normal limits
 AFI Sum:     16.49   cm      60   %Tile     Larg Pckt:   5.98   cm
 RUQ:   5.98   cm    RLQ:    3.1    cm    LUQ:   2.92    cm   LLQ:    4.49   cm
Biometry

 BPD:       85  mm    G. Age:   34w 1d                CI:        70.63   70 - 86
                                                      FL/HC:      19.9   19.9 -

 HC:     322.4  mm    G. Age:   36w 3d       90  %    HC/AC:      1.05   0.96 -

 AC:       306  mm    G. Age:   34w 4d       86  %    FL/BPD:     75.4   71 - 87
 FL:      64.1  mm    G. Age:   33w 1d       38  %    FL/AC:      20.9   20 - 24
 HUM:     56.8  mm    G. Age:   33w 0d       56  %

 Est. FW:    0955  gm      5 lb 5 oz     78  %
Gestational Age

 U/S Today:     34w 4d                                        EDD:   05/22/12
 Best:          33w 1d    Det. By:   Early Ultrasound         EDD:   06/01/12
Anatomy

 Cranium:          Appears normal         Aortic Arch:      Basic anatomy
                                                            exam per order
 Fetal Cavum:      Appears normal         Ductal Arch:      Basic anatomy
                                                            exam per order
 Ventricles:       Appears normal         Diaphragm:        Appears normal
 Choroid Plexus:   Appears normal         Stomach:          Appears normal
 Cerebellum:       Appears normal         Abdomen:          Appears normal
 Posterior Fossa:  Appears normal         Abdominal Wall:   Not well visualized
 Nuchal Fold:      Not applicable (>20    Cord Vessels:     Appears normal (3
                   wks GA)                                  vessel cord)
 Face:             Orbits appear          Kidneys:          Appear normal
                   normal
 Lips:             Appears normal         Bladder:          Appears normal
 Heart:            Appears normal         Spine:            Appears normal
                   (4CH, axis, and
                   situs)
 RVOT:             Appears normal         Lower             Appears normal
                                          Extremities:
 LVOT:             Appears normal         Upper             Not well visualized
                                          Extremities:

 Other:  Fetus appears to be a male. Nasal bone visualized. Complete fetal
         anatomic survey previously performed in office.  Technically difficult
         due to advanced GA and fetal position.
Targeted Anatomy

 Fetal Central Nervous System
 Lat. Ventricles:
Cervix Uterus Adnexa

 Cervical Length:   3.1       cm

 Cervix:       Normal appearance by translabial scan.
 Uterus:       No abnormality visualized.
 Cul De Sac:   No free fluid seen.
 Left Ovary:   Not visualized.
 Right Ovary:  Not visualized.
 Adnexa:     No abnormality visualized.
Impression

 Single living IUP with assigned GA of 33w 1d in cephalic
 position.
 EFW is at the 78th percentile for GA.
 Normal amniotic fluid volume and cervical length. Cervix
 measured translabially to be 3.1 cm.

 Thank you for sharing in the care of Ms. TANASACHI ABABII with
 questions or concerns.

## 2014-03-01 ENCOUNTER — Encounter (HOSPITAL_COMMUNITY): Payer: Self-pay

## 2015-03-21 IMAGING — US US FETAL BPP W/O NONSTRESS
1 series · 8 of 8 positions shown · non-contrast
Comparison: none

[Series 1: us fetal bpp w/o nonstress · non-contrast · 8 acquisitions, 8 frames shown]
[im 1/8]
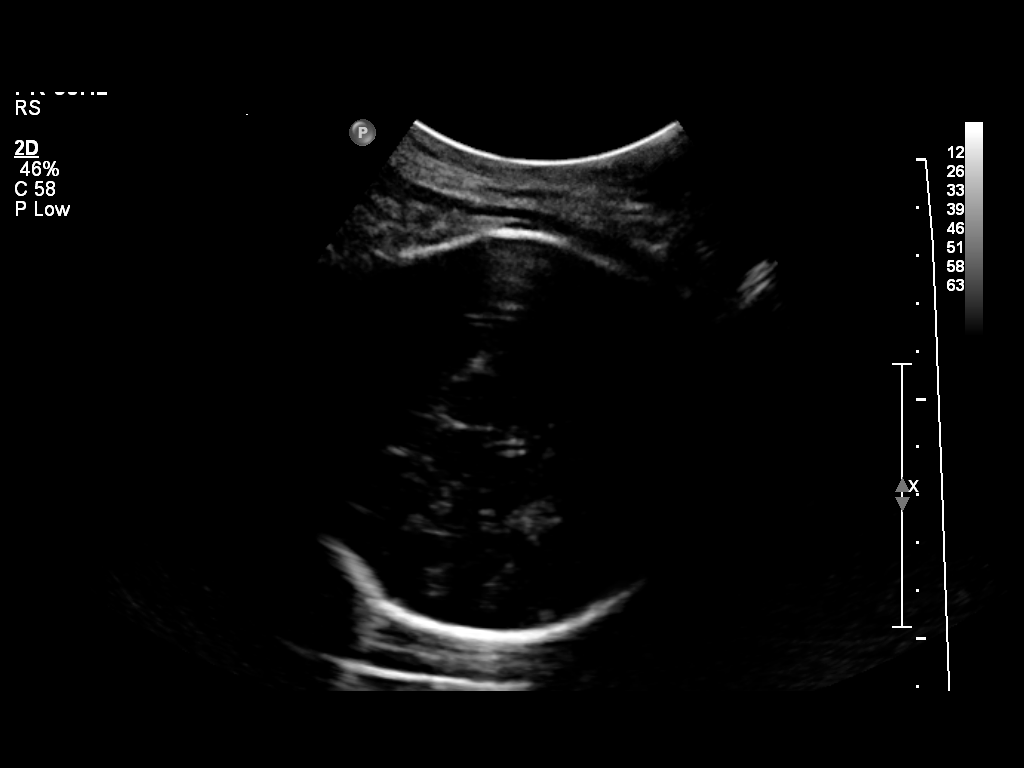
[im 2/8]
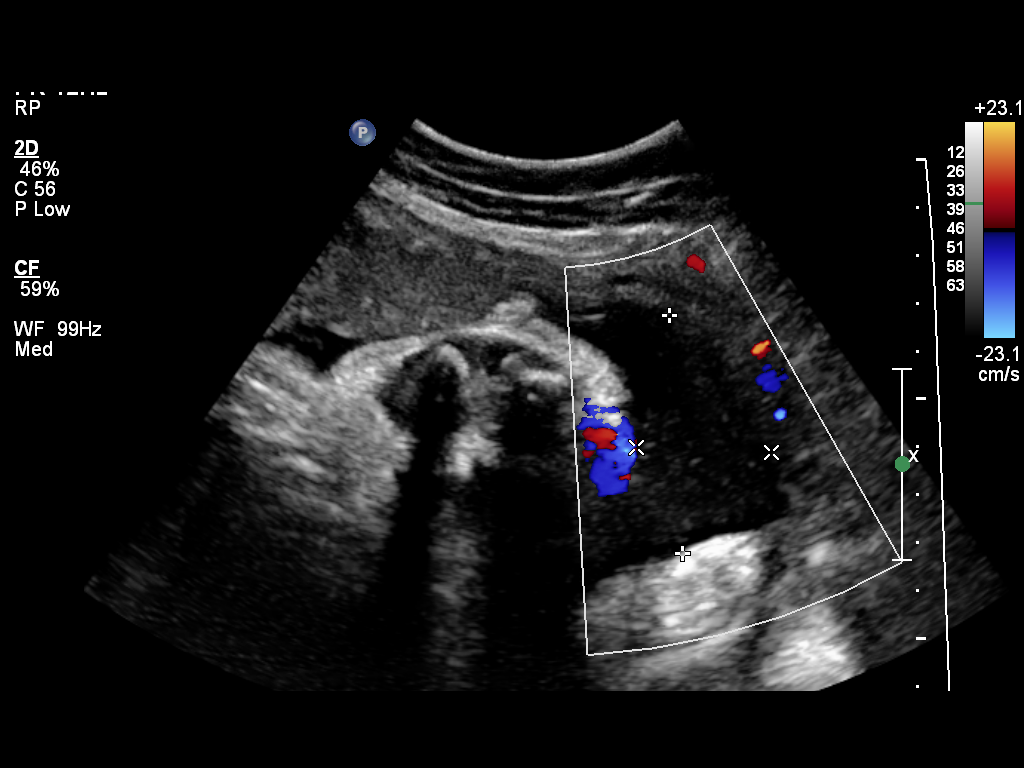
[im 3/8]
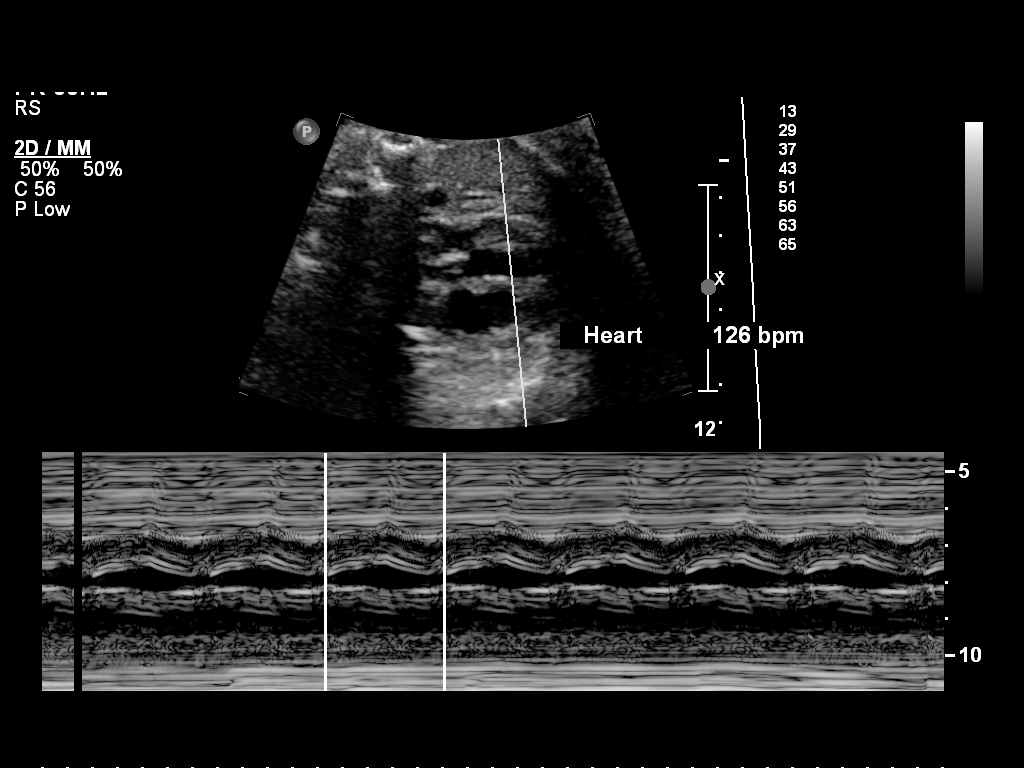
[im 4/8]
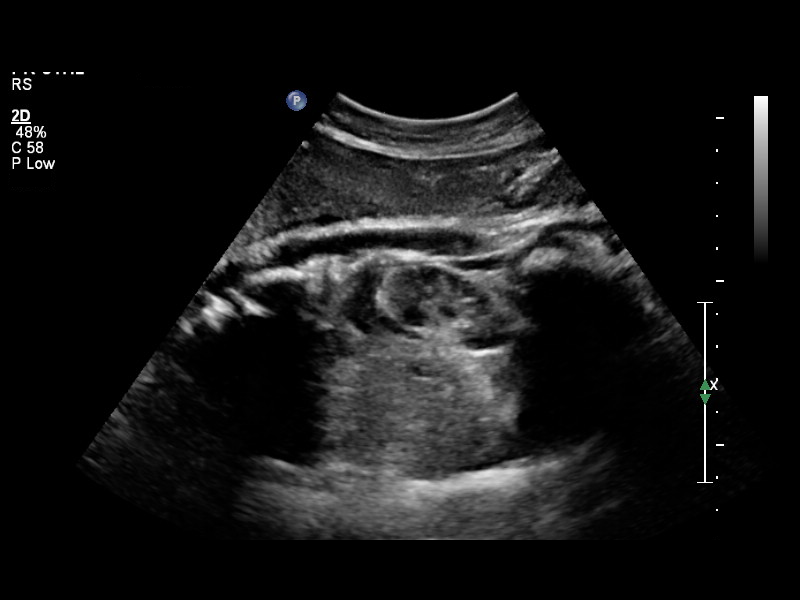
[im 5/8]
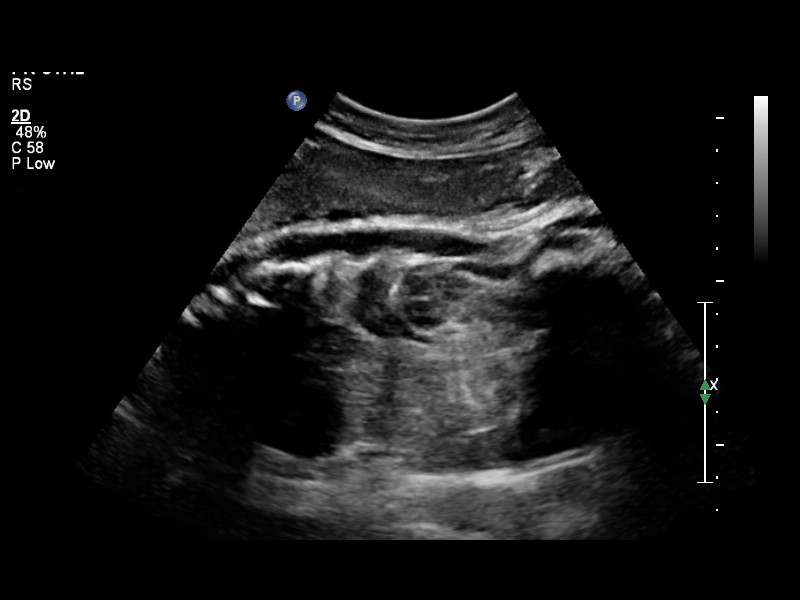
[im 6/8]
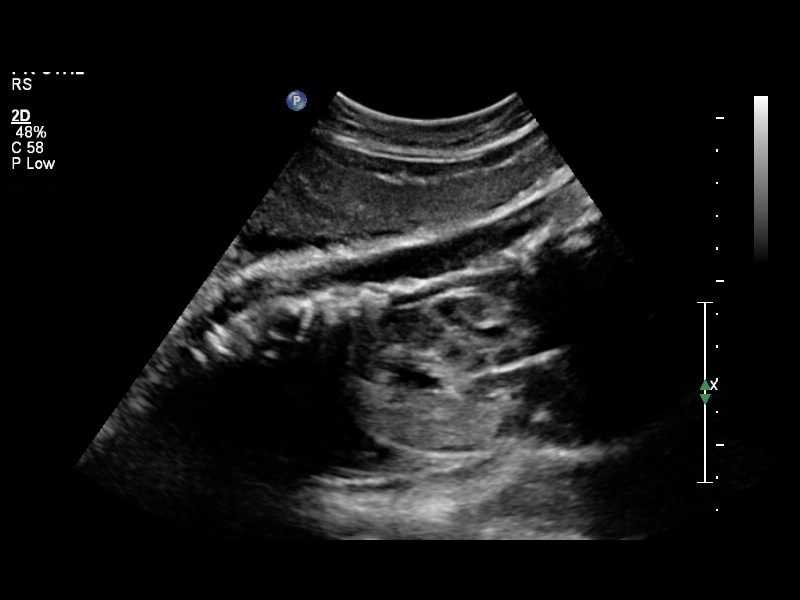
[im 7/8]
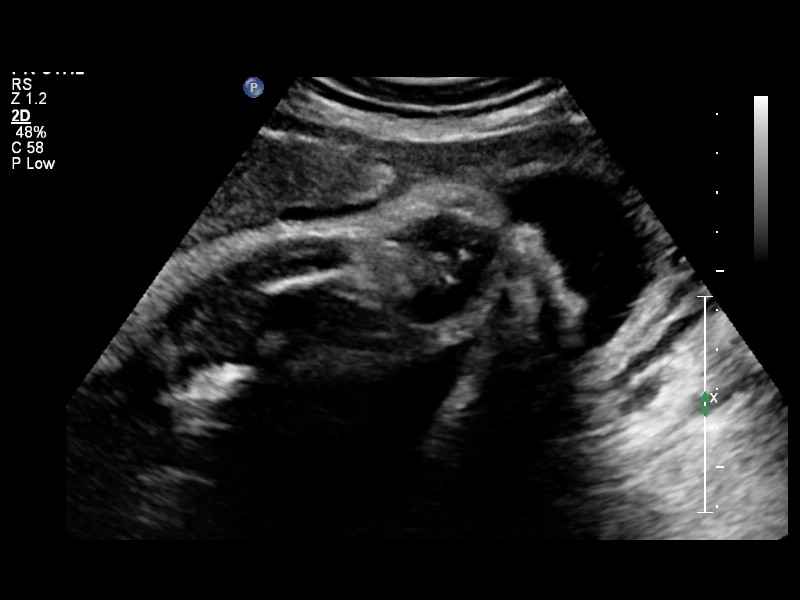
[im 8/8]
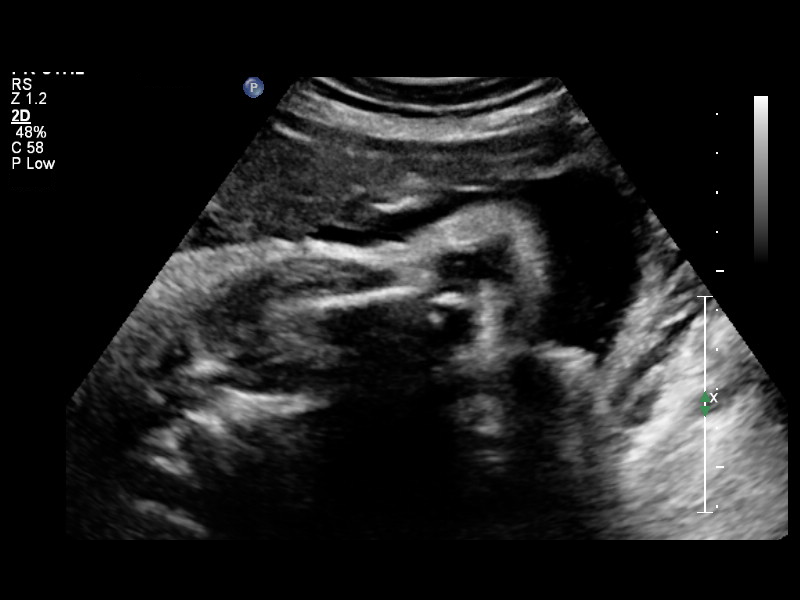

[8 of 8 positions shown; findings below may reference images not displayed]

OBSTETRICS REPORT
                      (Signed Final 09/16/2013 [DATE])

Service(s) Provided

Indications

 Decreased fetal movement

Fetal Evaluation

 Num Of Fetuses:    1
 Fetal Heart Rate:  126                          bpm
 Cardiac Activity:  Observed
 Presentation:      Cephalic

 Comment:    BPP [DATE] in 30 minutes

 Amniotic Fluid
 AFI FV:      Subjectively within normal limits
                                             Larg Pckt:    4.95  cm
Biophysical Evaluation

 Amniotic F.V:   Pocket => 2 cm two         F. Tone:        Observed
                 planes
 F. Movement:    Observed                   Score:          [DATE]
 F. Breathing:   Not Observed
Impression

 Single IUP at 52w5d
 BPP [DATE] (-2 for absent breathing movement)
 Normal amniotic fluid volume
Recommendations

 Please coorelate with NST - if NST is nonreactive or
 nonreassuirng, would move toward delivery; if reactive, would
 recommend continued 2x weekly antepartum fetal testing.

 Thank you for sharing in the care of Ms. BOWY MIEDEMA with
 questions or concerns.
